# Patient Record
Sex: Female | Born: 1955 | Race: Black or African American | Hispanic: No | Marital: Married | Smoking: Never smoker
Health system: Southern US, Community
[De-identification: ages and names within clinical notes are randomized; demographics above are authoritative.]

## PROBLEM LIST (undated history)

## (undated) DIAGNOSIS — I10 Essential (primary) hypertension: Secondary | ICD-10-CM

## (undated) HISTORY — PX: TONSILLECTOMY: SUR1361

## (undated) HISTORY — PX: CHOLECYSTECTOMY: SHX55

## (undated) HISTORY — PX: BACK SURGERY: SHX140

---

## 2005-03-05 ENCOUNTER — Ambulatory Visit: Payer: Self-pay | Admitting: Occupational Therapy

## 2006-04-01 ENCOUNTER — Ambulatory Visit: Payer: Self-pay | Admitting: Nurse Practitioner

## 2006-09-02 ENCOUNTER — Ambulatory Visit: Payer: Self-pay | Admitting: Unknown Physician Specialty

## 2007-04-06 ENCOUNTER — Ambulatory Visit: Payer: Self-pay | Admitting: Nurse Practitioner

## 2008-04-06 ENCOUNTER — Ambulatory Visit: Payer: Self-pay | Admitting: Nurse Practitioner

## 2009-04-27 ENCOUNTER — Ambulatory Visit: Payer: Self-pay | Admitting: Nurse Practitioner

## 2009-07-05 ENCOUNTER — Encounter: Payer: Self-pay | Admitting: Orthopedic Surgery

## 2009-07-26 ENCOUNTER — Encounter: Payer: Self-pay | Admitting: Orthopedic Surgery

## 2009-08-25 ENCOUNTER — Encounter: Payer: Self-pay | Admitting: Orthopedic Surgery

## 2009-09-25 ENCOUNTER — Encounter: Payer: Self-pay | Admitting: Orthopedic Surgery

## 2010-01-02 ENCOUNTER — Encounter: Payer: Self-pay | Admitting: Emergency Medicine

## 2010-01-02 ENCOUNTER — Observation Stay (HOSPITAL_COMMUNITY): Admission: EM | Admit: 2010-01-02 | Discharge: 2010-01-04 | Payer: Self-pay | Admitting: Cardiovascular Disease

## 2010-02-11 ENCOUNTER — Emergency Department (HOSPITAL_COMMUNITY)
Admission: EM | Admit: 2010-02-11 | Discharge: 2010-02-11 | Payer: Self-pay | Source: Home / Self Care | Admitting: Emergency Medicine

## 2010-02-12 ENCOUNTER — Emergency Department (HOSPITAL_COMMUNITY)
Admission: EM | Admit: 2010-02-12 | Discharge: 2010-02-12 | Payer: Self-pay | Source: Home / Self Care | Admitting: Emergency Medicine

## 2010-03-05 ENCOUNTER — Emergency Department (HOSPITAL_COMMUNITY)
Admission: EM | Admit: 2010-03-05 | Discharge: 2010-03-05 | Payer: Self-pay | Source: Home / Self Care | Admitting: Emergency Medicine

## 2010-03-12 LAB — CBC
HCT: 35.1 % — ABNORMAL LOW (ref 36.0–46.0)
Hemoglobin: 12.6 g/dL (ref 12.0–15.0)
MCH: 30.7 pg (ref 26.0–34.0)
MCHC: 35.9 g/dL (ref 30.0–36.0)
MCV: 85.4 fL (ref 78.0–100.0)
Platelets: 185 10*3/uL (ref 150–400)
RBC: 4.11 MIL/uL (ref 3.87–5.11)
RDW: 12.3 % (ref 11.5–15.5)
WBC: 5.3 10*3/uL (ref 4.0–10.5)

## 2010-03-12 LAB — DIFFERENTIAL
Basophils Absolute: 0 10*3/uL (ref 0.0–0.1)
Basophils Relative: 0 % (ref 0–1)
Eosinophils Absolute: 0 10*3/uL (ref 0.0–0.7)
Eosinophils Relative: 1 % (ref 0–5)
Lymphocytes Relative: 30 % (ref 12–46)
Lymphs Abs: 1.6 10*3/uL (ref 0.7–4.0)
Monocytes Absolute: 0.2 10*3/uL (ref 0.1–1.0)
Monocytes Relative: 4 % (ref 3–12)
Neutro Abs: 3.5 10*3/uL (ref 1.7–7.7)
Neutrophils Relative %: 65 % (ref 43–77)

## 2010-03-12 LAB — BASIC METABOLIC PANEL
BUN: 9 mg/dL (ref 6–23)
CO2: 27 mEq/L (ref 19–32)
Calcium: 9.5 mg/dL (ref 8.4–10.5)
Chloride: 104 mEq/L (ref 96–112)
Creatinine, Ser: 0.68 mg/dL (ref 0.4–1.2)
GFR calc Af Amer: >60 mL/min (ref >60)
GFR calc non Af Amer: >60 mL/min (ref >60)
Glucose, Bld: 101 mg/dL — ABNORMAL HIGH (ref 70–99)
Potassium: 3.8 mEq/L (ref 3.5–5.1)
Sodium: 140 mEq/L (ref 135–145)

## 2010-05-07 LAB — BASIC METABOLIC PANEL
BUN: 15 mg/dL (ref 6–23)
CO2: 25 mEq/L (ref 19–32)
Calcium: 9 mg/dL (ref 8.4–10.5)
Chloride: 102 mEq/L (ref 96–112)
Creatinine, Ser: 0.79 mg/dL (ref 0.4–1.2)
GFR calc Af Amer: >60 mL/min (ref >60)
GFR calc non Af Amer: >60 mL/min (ref >60)
Glucose, Bld: 109 mg/dL — ABNORMAL HIGH (ref 70–99)
Potassium: 3.5 mEq/L (ref 3.5–5.1)
Sodium: 134 mEq/L — ABNORMAL LOW (ref 135–145)

## 2010-05-07 LAB — URINALYSIS, ROUTINE W REFLEX MICROSCOPIC
Bilirubin Urine: NEGATIVE
Glucose, UA: NEGATIVE mg/dL
Ketones, ur: NEGATIVE mg/dL
Nitrite: NEGATIVE
Protein, ur: NEGATIVE mg/dL
Specific Gravity, Urine: 1.01 (ref 1.005–1.030)
Urobilinogen, UA: 0.2 mg/dL (ref 0.0–1.0)
pH: 6 (ref 5.0–8.0)

## 2010-05-07 LAB — POCT CARDIAC MARKERS
CKMB, poc: <1 ng/mL — ABNORMAL LOW (ref 1.0–8.0)
CKMB, poc: <1 ng/mL — ABNORMAL LOW (ref 1.0–8.0)
CKMB, poc: <1 ng/mL — ABNORMAL LOW (ref 1.0–8.0)
Myoglobin, poc: 40.5 ng/mL (ref 12–200)
Myoglobin, poc: 45.4 ng/mL (ref 12–200)
Myoglobin, poc: 45.9 ng/mL (ref 12–200)
Troponin i, poc: <0.05 ng/mL (ref 0.00–0.09)
Troponin i, poc: <0.05 ng/mL (ref 0.00–0.09)
Troponin i, poc: <0.05 ng/mL (ref 0.00–0.09)

## 2010-05-07 LAB — URINE MICROSCOPIC-ADD ON

## 2010-05-07 LAB — CBC
HCT: 33.5 % — ABNORMAL LOW (ref 36.0–46.0)
Hemoglobin: 11.8 g/dL — ABNORMAL LOW (ref 12.0–15.0)
MCH: 30.6 pg (ref 26.0–34.0)
MCHC: 35.2 g/dL (ref 30.0–36.0)
MCV: 86.8 fL (ref 78.0–100.0)
Platelets: 183 10*3/uL (ref 150–400)
RBC: 3.86 MIL/uL — ABNORMAL LOW (ref 3.87–5.11)
RDW: 12.6 % (ref 11.5–15.5)
WBC: 9 10*3/uL (ref 4.0–10.5)

## 2010-05-07 LAB — DIFFERENTIAL
Basophils Absolute: 0 10*3/uL (ref 0.0–0.1)
Basophils Relative: 0 % (ref 0–1)
Eosinophils Absolute: 0.1 10*3/uL (ref 0.0–0.7)
Eosinophils Relative: 2 % (ref 0–5)
Lymphocytes Relative: 31 % (ref 12–46)
Lymphs Abs: 2.8 10*3/uL (ref 0.7–4.0)
Monocytes Absolute: 0.4 10*3/uL (ref 0.1–1.0)
Monocytes Relative: 4 % (ref 3–12)
Neutro Abs: 5.7 10*3/uL (ref 1.7–7.7)
Neutrophils Relative %: 63 % (ref 43–77)

## 2010-05-08 LAB — CK TOTAL AND CKMB (NOT AT ARMC)
CK, MB: 1.1 ng/mL (ref 0.3–4.0)
CK, MB: 1.5 ng/mL (ref 0.3–4.0)
Relative Index: INVALID (ref 0.0–2.5)
Relative Index: INVALID (ref 0.0–2.5)
Total CK: 83 U/L (ref 7–177)
Total CK: 85 U/L (ref 7–177)

## 2010-05-08 LAB — URINALYSIS, ROUTINE W REFLEX MICROSCOPIC
Bilirubin Urine: NEGATIVE
Glucose, UA: NEGATIVE mg/dL
Hgb urine dipstick: NEGATIVE
Ketones, ur: NEGATIVE mg/dL
Nitrite: NEGATIVE
Protein, ur: NEGATIVE mg/dL
Specific Gravity, Urine: 1.01 (ref 1.005–1.030)
Urobilinogen, UA: 0.2 mg/dL (ref 0.0–1.0)
pH: 7 (ref 5.0–8.0)

## 2010-05-08 LAB — CBC
HCT: 37.1 % (ref 36.0–46.0)
Hemoglobin: 12.6 g/dL (ref 12.0–15.0)
MCH: 29.8 pg (ref 26.0–34.0)
MCH: 30.4 pg (ref 26.0–34.0)
MCHC: 33 g/dL (ref 30.0–36.0)
MCHC: 34.1 g/dL (ref 30.0–36.0)
MCV: 89.1 fL (ref 78.0–100.0)
MCV: 90.3 fL (ref 78.0–100.0)
Platelets: 199 10*3/uL (ref 150–400)
Platelets: 218 10*3/uL (ref 150–400)
RBC: 4.16 MIL/uL (ref 3.87–5.11)
RDW: 13.3 % (ref 11.5–15.5)
WBC: 8.7 10*3/uL (ref 4.0–10.5)

## 2010-05-08 LAB — CARDIAC PANEL(CRET KIN+CKTOT+MB+TROPI)
CK, MB: 1.1 ng/mL (ref 0.3–4.0)
CK, MB: 1.6 ng/mL (ref 0.3–4.0)
Relative Index: INVALID (ref 0.0–2.5)
Relative Index: INVALID (ref 0.0–2.5)
Relative Index: INVALID (ref 0.0–2.5)
Total CK: 76 U/L (ref 7–177)
Total CK: 86 U/L (ref 7–177)
Total CK: 89 U/L (ref 7–177)
Troponin I: 0.05 ng/mL (ref 0.00–0.06)
Troponin I: 0.08 ng/mL — ABNORMAL HIGH (ref 0.00–0.06)
Troponin I: 0.16 ng/mL — ABNORMAL HIGH (ref 0.00–0.06)

## 2010-05-08 LAB — TROPONIN I
Troponin I: 0.13 ng/mL — ABNORMAL HIGH (ref 0.00–0.06)
Troponin I: 0.28 ng/mL — ABNORMAL HIGH (ref 0.00–0.06)

## 2010-05-08 LAB — DIFFERENTIAL
Basophils Absolute: 0 10*3/uL (ref 0.0–0.1)
Basophils Relative: 0 % (ref 0–1)
Eosinophils Absolute: 0 10*3/uL (ref 0.0–0.7)
Eosinophils Relative: 1 % (ref 0–5)
Lymphocytes Relative: 10 % — ABNORMAL LOW (ref 12–46)
Lymphs Abs: 0.9 10*3/uL (ref 0.7–4.0)
Monocytes Absolute: 0.3 10*3/uL (ref 0.1–1.0)
Monocytes Relative: 4 % (ref 3–12)
Neutro Abs: 7.4 10*3/uL (ref 1.7–7.7)
Neutrophils Relative %: 86 % — ABNORMAL HIGH (ref 43–77)

## 2010-05-08 LAB — BASIC METABOLIC PANEL
BUN: 10 mg/dL (ref 6–23)
BUN: 14 mg/dL (ref 6–23)
BUN: 16 mg/dL (ref 6–23)
CO2: 25 mEq/L (ref 19–32)
CO2: 27 mEq/L (ref 19–32)
CO2: 28 mEq/L (ref 19–32)
Calcium: 9.2 mg/dL (ref 8.4–10.5)
Calcium: 9.3 mg/dL (ref 8.4–10.5)
Calcium: 9.4 mg/dL (ref 8.4–10.5)
Chloride: 104 mEq/L (ref 96–112)
Chloride: 109 mEq/L (ref 96–112)
Chloride: 111 mEq/L (ref 96–112)
Creatinine, Ser: 0.68 mg/dL (ref 0.4–1.2)
Creatinine, Ser: 0.73 mg/dL (ref 0.4–1.2)
Creatinine, Ser: 0.74 mg/dL (ref 0.4–1.2)
GFR calc Af Amer: >60 mL/min (ref >60)
GFR calc Af Amer: >60 mL/min (ref >60)
GFR calc non Af Amer: >60 mL/min (ref >60)
GFR calc non Af Amer: >60 mL/min (ref >60)
Glucose, Bld: 110 mg/dL — ABNORMAL HIGH (ref 70–99)
Glucose, Bld: 119 mg/dL — ABNORMAL HIGH (ref 70–99)
Glucose, Bld: 89 mg/dL (ref 70–99)
Potassium: 3.6 mEq/L (ref 3.5–5.1)
Potassium: 3.6 mEq/L (ref 3.5–5.1)
Sodium: 139 mEq/L (ref 135–145)
Sodium: 140 mEq/L (ref 135–145)

## 2010-05-08 LAB — URINE MICROSCOPIC-ADD ON

## 2010-05-08 LAB — PROTIME-INR
INR: 1.08 (ref 0.00–1.49)
Prothrombin Time: 14.2 seconds (ref 11.6–15.2)

## 2010-05-08 LAB — GLUCOSE, CAPILLARY: Glucose-Capillary: 116 mg/dL — ABNORMAL HIGH (ref 70–99)

## 2010-05-08 LAB — LIPID PANEL
Cholesterol: 169 mg/dL (ref 0–200)
HDL: 63 mg/dL (ref >39)
LDL Cholesterol: 99 mg/dL (ref 0–99)
Total CHOL/HDL Ratio: 2.7 RATIO
Triglycerides: 36 mg/dL (ref <150)
VLDL: 7 mg/dL (ref 0–40)

## 2010-05-08 LAB — TSH: TSH: 3.749 u[IU]/mL (ref 0.350–4.500)

## 2010-05-08 LAB — HEPARIN LEVEL (UNFRACTIONATED)
Heparin Unfractionated: 0.32 IU/mL (ref 0.30–0.70)
Heparin Unfractionated: <0.1 IU/mL — ABNORMAL LOW (ref 0.30–0.70)

## 2010-05-08 LAB — APTT: aPTT: 34 seconds (ref 24–37)

## 2010-06-11 ENCOUNTER — Ambulatory Visit: Payer: Self-pay | Admitting: Nurse Practitioner

## 2010-06-20 ENCOUNTER — Ambulatory Visit: Payer: Self-pay | Admitting: Unknown Physician Specialty

## 2010-06-28 ENCOUNTER — Ambulatory Visit: Payer: Self-pay | Admitting: Nurse Practitioner

## 2010-06-29 ENCOUNTER — Ambulatory Visit: Payer: Self-pay | Admitting: Neurology

## 2010-07-04 ENCOUNTER — Ambulatory Visit: Payer: Self-pay | Admitting: Unknown Physician Specialty

## 2010-07-06 LAB — PATHOLOGY REPORT

## 2010-07-31 ENCOUNTER — Ambulatory Visit: Payer: Self-pay | Admitting: Neurology

## 2010-08-03 ENCOUNTER — Ambulatory Visit: Payer: Self-pay | Admitting: Surgery

## 2010-08-06 ENCOUNTER — Ambulatory Visit: Payer: Self-pay | Admitting: Surgery

## 2011-08-05 ENCOUNTER — Ambulatory Visit: Payer: Self-pay | Admitting: Nurse Practitioner

## 2012-08-10 ENCOUNTER — Ambulatory Visit: Payer: Self-pay

## 2014-05-06 ENCOUNTER — Ambulatory Visit: Payer: Self-pay | Admitting: Nurse Practitioner

## 2014-11-17 ENCOUNTER — Emergency Department (HOSPITAL_COMMUNITY): Payer: BC Managed Care – PPO

## 2014-11-17 ENCOUNTER — Inpatient Hospital Stay (HOSPITAL_COMMUNITY)
Admission: EM | Admit: 2014-11-17 | Discharge: 2014-11-18 | DRG: 313 | Disposition: A | Discharge status: Home / Self Care | Payer: BC Managed Care – PPO | Attending: Cardiovascular Disease | Admitting: Cardiovascular Disease

## 2014-11-17 ENCOUNTER — Encounter (HOSPITAL_COMMUNITY): Payer: Self-pay

## 2014-11-17 DIAGNOSIS — R899 Unspecified abnormal finding in specimens from other organs, systems and tissues: Secondary | ICD-10-CM | POA: Diagnosis present

## 2014-11-17 DIAGNOSIS — E785 Hyperlipidemia, unspecified: Secondary | ICD-10-CM | POA: Diagnosis present

## 2014-11-17 DIAGNOSIS — I10 Essential (primary) hypertension: Secondary | ICD-10-CM | POA: Diagnosis present

## 2014-11-17 DIAGNOSIS — R7989 Other specified abnormal findings of blood chemistry: Secondary | ICD-10-CM | POA: Diagnosis not present

## 2014-11-17 DIAGNOSIS — Z7982 Long term (current) use of aspirin: Secondary | ICD-10-CM | POA: Diagnosis not present

## 2014-11-17 DIAGNOSIS — Z79899 Other long term (current) drug therapy: Secondary | ICD-10-CM

## 2014-11-17 DIAGNOSIS — Z9049 Acquired absence of other specified parts of digestive tract: Secondary | ICD-10-CM | POA: Diagnosis not present

## 2014-11-17 DIAGNOSIS — E876 Hypokalemia: Secondary | ICD-10-CM | POA: Diagnosis present

## 2014-11-17 DIAGNOSIS — I249 Acute ischemic heart disease, unspecified: Secondary | ICD-10-CM | POA: Diagnosis present

## 2014-11-17 DIAGNOSIS — R0789 Other chest pain: Principal | ICD-10-CM | POA: Diagnosis present

## 2014-11-17 DIAGNOSIS — R079 Chest pain, unspecified: Secondary | ICD-10-CM

## 2014-11-17 HISTORY — DX: Essential (primary) hypertension: I10

## 2014-11-17 LAB — URINALYSIS, ROUTINE W REFLEX MICROSCOPIC
Bilirubin Urine: NEGATIVE
GLUCOSE, UA: NEGATIVE mg/dL
Hgb urine dipstick: NEGATIVE
Ketones, ur: NEGATIVE mg/dL
NITRITE: NEGATIVE
PH: 6 (ref 5.0–8.0)
Protein, ur: NEGATIVE mg/dL
SPECIFIC GRAVITY, URINE: 1.01 (ref 1.005–1.030)
Urobilinogen, UA: 0.2 mg/dL (ref 0.0–1.0)

## 2014-11-17 LAB — CBC WITH DIFFERENTIAL/PLATELET
BASOS ABS: 0 10*3/uL (ref 0.0–0.1)
BASOS PCT: 0 %
EOS ABS: 0.2 10*3/uL (ref 0.0–0.7)
Eosinophils Relative: 3 %
HEMATOCRIT: 35.8 % — AB (ref 36.0–46.0)
Hemoglobin: 12.1 g/dL (ref 12.0–15.0)
Lymphocytes Relative: 21 %
Lymphs Abs: 1.9 10*3/uL (ref 0.7–4.0)
MCH: 30.3 pg (ref 26.0–34.0)
MCHC: 33.8 g/dL (ref 30.0–36.0)
MCV: 89.5 fL (ref 78.0–100.0)
MONO ABS: 0.4 10*3/uL (ref 0.1–1.0)
Monocytes Relative: 4 %
NEUTROS ABS: 6.5 10*3/uL (ref 1.7–7.7)
Neutrophils Relative %: 72 %
PLATELETS: 205 10*3/uL (ref 150–400)
RBC: 4 MIL/uL (ref 3.87–5.11)
RDW: 12.7 % (ref 11.5–15.5)
WBC: 9.1 10*3/uL (ref 4.0–10.5)

## 2014-11-17 LAB — URINE MICROSCOPIC-ADD ON

## 2014-11-17 LAB — COMPREHENSIVE METABOLIC PANEL
ALBUMIN: 3.9 g/dL (ref 3.5–5.0)
ALT: 17 U/L (ref 14–54)
AST: 24 U/L (ref 15–41)
Alkaline Phosphatase: 74 U/L (ref 38–126)
Anion gap: 6 (ref 5–15)
BILIRUBIN TOTAL: 0.4 mg/dL (ref 0.3–1.2)
BUN: 16 mg/dL (ref 6–20)
CHLORIDE: 103 mmol/L (ref 101–111)
CO2: 29 mmol/L (ref 22–32)
Calcium: 9.2 mg/dL (ref 8.9–10.3)
Creatinine, Ser: 0.7 mg/dL (ref 0.44–1.00)
GFR calc Af Amer: >60 mL/min (ref >60)
GFR calc non Af Amer: >60 mL/min (ref >60)
GLUCOSE: 103 mg/dL — AB (ref 65–99)
POTASSIUM: 3.3 mmol/L — AB (ref 3.5–5.1)
SODIUM: 138 mmol/L (ref 135–145)
Total Protein: 7.6 g/dL (ref 6.5–8.1)

## 2014-11-17 LAB — I-STAT TROPONIN, ED: TROPONIN I, POC: 0.08 ng/mL (ref 0.00–0.08)

## 2014-11-17 LAB — TROPONIN I
TROPONIN I: 0.4 ng/mL — AB (ref <0.031)
Troponin I: 0.11 ng/mL — ABNORMAL HIGH (ref <0.031)

## 2014-11-17 LAB — HEPARIN LEVEL (UNFRACTIONATED): Heparin Unfractionated: 0.9 IU/mL — ABNORMAL HIGH (ref 0.30–0.70)

## 2014-11-17 MED ORDER — HEPARIN BOLUS VIA INFUSION
4000.0000 [IU] | Freq: Once | INTRAVENOUS | Status: AC
  Administered 2014-11-17: 4000 [IU] via INTRAVENOUS

## 2014-11-17 MED ORDER — HEPARIN (PORCINE) IN NACL 100-0.45 UNIT/ML-% IJ SOLN
1000.0000 [IU]/h | INTRAMUSCULAR | Status: DC
  Administered 2014-11-17: 1000 [IU]/h via INTRAVENOUS
  Filled 2014-11-17: qty 250

## 2014-11-17 MED ORDER — METOPROLOL TARTRATE 100 MG PO TABS
100.0000 mg | ORAL_TABLET | Freq: Every day | ORAL | Status: DC
  Administered 2014-11-17: 100 mg via ORAL
  Filled 2014-11-17: qty 1

## 2014-11-17 MED ORDER — FERROUS SULFATE 325 (65 FE) MG PO TABS: 325.0000 mg | ORAL_TABLET | Freq: Every day | ORAL | Status: DC

## 2014-11-17 MED ORDER — HYDROCHLOROTHIAZIDE 25 MG PO TABS
25.0000 mg | ORAL_TABLET | Freq: Every day | ORAL | Status: DC
  Administered 2014-11-17 – 2014-11-18 (×2): 25 mg via ORAL
  Filled 2014-11-17 (×2): qty 1

## 2014-11-17 MED ORDER — ATORVASTATIN CALCIUM 40 MG PO TABS
40.0000 mg | ORAL_TABLET | Freq: Every day | ORAL | Status: DC
  Administered 2014-11-17 – 2014-11-18 (×2): 40 mg via ORAL
  Filled 2014-11-17 (×2): qty 1

## 2014-11-17 MED ORDER — ASPIRIN EC 81 MG PO TBEC
81.0000 mg | DELAYED_RELEASE_TABLET | Freq: Every day | ORAL | Status: DC
  Administered 2014-11-18: 81 mg via ORAL
  Filled 2014-11-17: qty 1

## 2014-11-17 MED ORDER — HEPARIN (PORCINE) IN NACL 100-0.45 UNIT/ML-% IJ SOLN
550.0000 [IU]/h | INTRAMUSCULAR | Status: DC
Start: 2014-11-17 — End: 2014-11-18

## 2014-11-17 MED ORDER — NITROGLYCERIN 0.4 MG SL SUBL: 0.4000 mg | SUBLINGUAL_TABLET | SUBLINGUAL | Status: DC | PRN

## 2014-11-17 MED ORDER — ACETAMINOPHEN 325 MG PO TABS
650.0000 mg | ORAL_TABLET | ORAL | Status: DC | PRN
  Administered 2014-11-18: 650 mg via ORAL
  Filled 2014-11-17: qty 2

## 2014-11-17 MED ORDER — ASPIRIN 300 MG RE SUPP: 300.0000 mg | RECTAL | Status: AC

## 2014-11-17 MED ORDER — ASPIRIN 81 MG PO CHEW
324.0000 mg | CHEWABLE_TABLET | ORAL | Status: AC
  Administered 2014-11-17: 324 mg via ORAL
  Filled 2014-11-17: qty 4

## 2014-11-17 MED ORDER — ONDANSETRON HCL 4 MG/2ML IJ SOLN: 4.0000 mg | Freq: Three times a day (TID) | INTRAMUSCULAR | Status: AC | PRN

## 2014-11-17 MED ORDER — ATORVASTATIN CALCIUM 40 MG PO TABS: 40.0000 mg | ORAL_TABLET | Freq: Every day | ORAL | Status: DC

## 2014-11-17 MED ORDER — POTASSIUM CHLORIDE ER 10 MEQ PO TBCR
20.0000 meq | EXTENDED_RELEASE_TABLET | Freq: Three times a day (TID) | ORAL | Status: DC
  Administered 2014-11-17 – 2014-11-18 (×3): 20 meq via ORAL
  Filled 2014-11-17 (×7): qty 2

## 2014-11-17 MED ORDER — HEPARIN (PORCINE) IN NACL 100-0.45 UNIT/ML-% IJ SOLN: 1000.0000 [IU]/h | INTRAMUSCULAR | Status: DC

## 2014-11-17 NOTE — ED Notes (Signed)
Ems reports pt started feeling lightheaded, palpitations, upper abd pain/burning, and generalized weakness.  EMS reports 12 lead ekg wnl, cbg 99.  Pt alert and oriented.  Pt reports started taking HCTZ 2 months ago.  Also no bm since Tuesday.  Last voided at 5am this morning.

## 2014-11-17 NOTE — ED Provider Notes (Signed)
CSN: [id number]     Arrival date & time 11/17/14  1117 History   First MD Initiated Contact with Patient 11/17/14 1138     Chief Complaint  Patient presents with  . Fatigue     HPI  Expand All Collapse All   Ems reports pt started feeling lightheaded, palpitations, upper abd pain/burning, and generalized weakness. EMS reports 12 lead ekg wnl, cbg 99. Pt alert and oriented. Pt reports started taking HCTZ 2 months ago. Also no bm since Tuesday. Last voided at 5am this morning.        Past Medical History  Diagnosis Date  . Hypertension    Past Surgical History  Procedure Laterality Date  . Cholecystectomy    . Back surgery    . Tonsillectomy     No family history on file. Social History  Substance Use Topics  . Smoking status: Never Smoker   . Smokeless tobacco: None  . Alcohol Use: No   OB History    No data available     Review of Systems All other systems reviewed and are negative   Allergies  Review of patient's allergies indicates no known allergies.  Home Medications   Prior to Admission medications   Medication Sig Start Date End Date Taking? Authorizing Provider  aspirin EC 81 MG tablet Take 81 mg by mouth daily.   Yes Historical Provider, MD  Ca Carbonate-Mag Hydroxide (ROLAIDS PO) Take 1 tablet by mouth daily.   Yes Historical Provider, MD  cetirizine (ZYRTEC) 10 MG tablet Take 10 mg by mouth daily.   Yes Historical Provider, MD  cholecalciferol (VITAMIN D) 1000 UNITS tablet Take 1,000 Units by mouth daily.   Yes Historical Provider, MD  ferrous sulfate 325 (65 FE) MG tablet Take 325 mg by mouth daily with breakfast.   Yes Historical Provider, MD  hydrochlorothiazide (HYDRODIURIL) 25 MG tablet Take 25 mg by mouth daily.   Yes Historical Provider, MD  metoprolol (LOPRESSOR) 100 MG tablet Take 100 mg by mouth daily.   Yes Historical Provider, MD   BP 125/59 mmHg  Pulse 63  Temp(Src) 98.2 F (36.8 C) (Oral)  Resp 17  Ht  (1.575 m)  Wt 138 lb  (62.596 kg)  BMI 25.23 kg/m2  SpO2 100% Physical Exam Physical Exam  Nursing note and vitals reviewed. Constitutional: She is oriented to person, place, and time. She appears well-developed and well-nourished. No distress.  HENT:  Head: Normocephalic and atraumatic.  Eyes: Pupils are equal, round, and reactive to light.  Neck: Normal range of motion.  Cardiovascular: Normal rate and intact distal pulses.   Pulmonary/Chest: No respiratory distress.  Abdominal: Normal appearance. She exhibits no distension.  Musculoskeletal: Normal range of motion.  Neurological: She is alert and oriented to person, place, and time. No cranial nerve deficit.  Skin: Skin is warm and dry. No rash noted.  Psychiatric: She has a normal mood and affect. Her behavior is normal.   ED Course  Procedures (including critical care time) Labs Review Labs Reviewed  CBC WITH DIFFERENTIAL/PLATELET - Abnormal; Notable for the following:    HCT 35.8 (*)    All other components within normal limits  COMPREHENSIVE METABOLIC PANEL - Abnormal; Notable for the following:    Potassium 3.3 (*)    Glucose, Bld 103 (*)    All other components within normal limits  TROPONIN I - Abnormal; Notable for the following:    Troponin I 0.11 (*)    All other components within  normal limits  URINALYSIS, ROUTINE W REFLEX MICROSCOPIC (NOT AT [facility])  Rosezena Sensor, ED    Imaging Review Dg Chest Portable 1 View  11/17/2014   CLINICAL DATA:  Sudden onset dizziness, weakness, and hypertension at work today.  EXAM: PORTABLE CHEST 1 VIEW  COMPARISON:  02/11/2010  FINDINGS: Lower cervical plate and screw fixator.  The lungs appear clear.  Cardiac and mediastinal contours normal.  No pleural effusion identified.  IMPRESSION: 1. No significant abnormality observed.   Electronically Signed   By: Gaylyn Rong M.D.   On: 11/17/2014 13:12   I have personally reviewed and evaluated these images and lab results as part of my medical  decision-making.   EKG Interpretation   Date/Time:  Thursday November 17 2014 11:23:28 EDT Ventricular Rate:  66 PR Interval:  161 QRS Duration: 71 QT Interval:  377 QTC Calculation: 395 R Axis:   58 Text Interpretation:  Sinus rhythm Baseline wander in lead(s) I V1  Confirmed by BEATON  MD, ROBERT ([id number]) on 11/17/2014 11:38:44 AM     CRITICAL CARE Performed by: Nelva Nay L Total critical care time: 30 min Critical care time was exclusive of separately billable procedures and treating other patients. Critical care was necessary to treat or prevent imminent or life-threatening deterioration. Critical care was time spent personally by me on the following activities: development of treatment plan with patient and/or surrogate as well as nursing, discussions with consultants, evaluation of patient's response to treatment, examination of patient, obtaining history from patient or surrogate, ordering and performing treatments and interventions, ordering and review of laboratory studies, ordering and review of radiographic studies, pulse oximetry and re-evaluation of patient's condition.   I discussed the case with her cardiologist Dr. Arva Chafe, who requested she was to be transferred to [facility].  Heparin bolus and heparin drip were started. MDM   Final diagnoses:  Abnormal laboratory test        Nelva Nay, MD 11/17/14 1326

## 2014-11-17 NOTE — H&P (Signed)
Referring Physician:  [name] is an 59 y.o. female.                       Chief Complaint: Weakness and upper abdominal tightness  HPI: 59 year old female with normal coronaries in 2012 has 1 day history of generalized weakness, epigastric area tightness and elevated troponin-I. EKG was unremarkable. PMH positive for uncontrolled hypertension.  Past Medical History  Diagnosis Date  . Hypertension       Past Surgical History  Procedure Laterality Date  . Cholecystectomy    . Back surgery    . Tonsillectomy      No family history on file. Social History:  reports that she has never smoked. She does not have any smokeless tobacco history on file. She reports that she does not drink alcohol or use illicit drugs.  Allergies: No Known Allergies  Medications Prior to Admission  Medication Sig Dispense Refill  . aspirin EC 81 MG tablet Take 81 mg by mouth daily.    . Ca Carbonate-Mag Hydroxide (ROLAIDS PO) Take 1 tablet by mouth daily.    . cetirizine (ZYRTEC) 10 MG tablet Take 10 mg by mouth daily.    . cholecalciferol (VITAMIN D) 1000 UNITS tablet Take 1,000 Units by mouth daily.    . ferrous sulfate 325 (65 FE) MG tablet Take 325 mg by mouth daily with breakfast.    . hydrochlorothiazide (HYDRODIURIL) 25 MG tablet Take 25 mg by mouth daily.    . metoprolol (LOPRESSOR) 100 MG tablet Take 100 mg by mouth daily.      Results for orders placed or performed during the hospital encounter of 11/17/14 (from the past 48 hour(s))  CBC with Differential     Status: Abnormal   Collection Time: 11/17/14 11:46 AM  Result Value Ref Range   WBC 9.1 4.0 - 10.5 K/uL   RBC 4.00 3.87 - 5.11 MIL/uL   Hemoglobin 12.1 12.0 - 15.0 g/dL   HCT 35.8 (L) 36.0 - 46.0 %   MCV 89.5 78.0 - 100.0 fL   MCH 30.3 26.0 - 34.0 pg   MCHC 33.8 30.0 - 36.0 g/dL   RDW 12.7 11.5 - 15.5 %   Platelets 205 150 - 400 K/uL   Neutrophils Relative % 72 %   Neutro Abs 6.5 1.7 - 7.7 K/uL   Lymphocytes Relative  21 %   Lymphs Abs 1.9 0.7 - 4.0 K/uL   Monocytes Relative 4 %   Monocytes Absolute 0.4 0.1 - 1.0 K/uL   Eosinophils Relative 3 %   Eosinophils Absolute 0.2 0.0 - 0.7 K/uL   Basophils Relative 0 %   Basophils Absolute 0.0 0.0 - 0.1 K/uL  Comprehensive metabolic panel     Status: Abnormal   Collection Time: 11/17/14 11:46 AM  Result Value Ref Range   Sodium 138 135 - 145 mmol/L   Potassium 3.3 (L) 3.5 - 5.1 mmol/L   Chloride 103 101 - 111 mmol/L   CO2 29 22 - 32 mmol/L   Glucose, Bld 103 (H) 65 - 99 mg/dL   BUN 16 6 - 20 mg/dL   Creatinine, Ser 0.70 0.44 - 1.00 mg/dL   Calcium 9.2 8.9 - 10.3 mg/dL   Total Protein 7.6 6.5 - 8.1 g/dL   Albumin 3.9 3.5 - 5.0 g/dL   AST 24 15 - 41 U/L   ALT 17 14 - 54 U/L   Alkaline Phosphatase 74 38 - 126 U/L  Total Bilirubin 0.4 0.3 - 1.2 mg/dL   GFR calc non Af Amer >60 >60 mL/min   GFR calc Af Amer >60 >60 mL/min    Comment: (NOTE) The eGFR has been calculated using the CKD EPI equation. This calculation has not been validated in all clinical situations. eGFR's persistently <60 mL/min signify possible Chronic Kidney Disease.    Anion gap 6 5 - 15  Troponin I     Status: Abnormal   Collection Time: 11/17/14 11:46 AM  Result Value Ref Range   Troponin I 0.11 (H) <0.031 ng/mL    Comment:        PERSISTENTLY INCREASED TROPONIN VALUES IN THE RANGE OF 0.04-0.49 ng/mL CAN BE SEEN IN:       -UNSTABLE ANGINA       -CONGESTIVE HEART FAILURE       -MYOCARDITIS       -CHEST TRAUMA       -ARRYHTHMIAS       -LATE PRESENTING MYOCARDIAL INFARCTION       -COPD   CLINICAL FOLLOW-UP RECOMMENDED.   I-stat troponin, ED     Status: None   Collection Time: 11/17/14 11:50 AM  Result Value Ref Range   Troponin i, poc 0.08 0.00 - 0.08 ng/mL   Comment 3            Comment: Due to the release kinetics of cTnI, a negative result within the first hours of the onset of symptoms does not rule out myocardial infarction with certainty. If myocardial  infarction is still suspected, repeat the test at appropriate intervals.   Urinalysis, Routine w reflex microscopic (not at [facility])     Status: Abnormal   Collection Time: 11/17/14  1:39 PM  Result Value Ref Range   Color, Urine YELLOW YELLOW   APPearance CLEAR CLEAR   Specific Gravity, Urine 1.010 1.005 - 1.030   pH 6.0 5.0 - 8.0   Glucose, UA NEGATIVE NEGATIVE mg/dL   Hgb urine dipstick NEGATIVE NEGATIVE   Bilirubin Urine NEGATIVE NEGATIVE   Ketones, ur NEGATIVE NEGATIVE mg/dL   Protein, ur NEGATIVE NEGATIVE mg/dL   Urobilinogen, UA 0.2 0.0 - 1.0 mg/dL   Nitrite NEGATIVE NEGATIVE   Leukocytes, UA SMALL (A) NEGATIVE  Urine microscopic-add on     Status: Abnormal   Collection Time: 11/17/14  1:39 PM  Result Value Ref Range   Squamous Epithelial / LPF MANY (A) RARE   WBC, UA 7-10 <3 WBC/hpf   Bacteria, UA FEW (A) RARE   Dg Chest Portable 1 View  11/17/2014   CLINICAL DATA:  Sudden onset dizziness, weakness, and hypertension at work today.  EXAM: PORTABLE CHEST 1 VIEW  COMPARISON:  02/11/2010  FINDINGS: Lower cervical plate and screw fixator.  The lungs appear clear.  Cardiac and mediastinal contours normal.  No pleural effusion identified.  IMPRESSION: 1. No significant abnormality observed.   Electronically Signed   By: Van Clines M.D.   On: 11/17/2014 13:12    Review Of Systems No weight gain/loss, Wears reading glasses, No cataract surgery, No asthma, No COPD, No chest pain, positive nausea, No kidney stone, No hepatitis, No GI or GU bleed, No stroke, seizures or psychiatric admission   Blood pressure 142/71, pulse 72, temperature 98.7 F (37.1 C), temperature source Oral, resp. rate 19, height 5' 2"  (1.575 m), weight 57.698 kg (127 lb 3.2 oz), SpO2 99 %. General appearance: alert, cooperative and appears stated age. No distress. Head: Normocephalic, without obvious abnormality, atraumatic. Eyes:  conjunctivae/corneas clear. PERRL, EOM's intact.  Neck: no adenopathy, no  carotid bruit, no JVD, supple, symmetrical, trachea midline and thyroid not enlarged. Resp: clear to auscultation bilaterally Chest wall: no tenderness Cardio: regular rate and rhythm, S1, S2 normal, II/VI systolic murmur, no click, rub or gallop GI: soft, non-tender; bowel sounds normal; no masses,  no organomegaly. Extremities: extremities normal, atraumatic, no cyanosis or edema Skin: Skin color, texture, turgor normal. No rashes or lesions Neurologic: Alert and oriented X 3, normal strength and tone. Normal symmetric reflexes. Normal coordination and gait  Assessment/Plan Acute coronary syndrome Hypertension Hypokalemia  Admit/r/o MI. Nuclear stress test v/s cardiac cath  [facility], MD  11/17/2014, 6:45 PM

## 2014-11-17 NOTE — ED Notes (Signed)
[facility] arrived  

## 2014-11-17 NOTE — ED Notes (Signed)
Pt left with [facility] 

## 2014-11-17 NOTE — Progress Notes (Signed)
ANTICOAGULATION CONSULT NOTE - Follow Up Consult  Pharmacy Consult for heparin Indication: chest pain/ACS  Labs:  Recent Labs  11/17/14 1146 11/17/14 2010 11/17/14 2235  HGB 12.1  --   --   HCT 35.8*  --   --   PLT 205  --   --   HEPARINUNFRC  --   --  0.90*  CREATININE 0.70  --   --   TROPONINI 0.11* 0.40*  --      Assessment: 58yo female supratherapeutic on heparin with dosing for CP; of note pt rec'd large bolus and initial gtt rate by EDP, may still be affecting level.  Goal of Therapy:  Heparin level 0.3-0.7 units/ml   Plan:  Will decrease heparin gtt by 2 units/kg/hr to 600 units/hr and check level with am labs.  Vernard Gambles, PharmD, BCPS  11/17/2014,11:57 PM

## 2014-11-17 NOTE — Progress Notes (Signed)
ANTICOAGULATION CONSULT NOTE - Initial Consult  Pharmacy Consult:  Heparin Indication: chest pain/ACS  No Known Allergies  Patient Measurements: Height:  (157.5 cm) Weight: 127 lb 3.2 oz (57.698 kg) IBW/kg (Calculated) : 50.1 Heparin Dosing Weight: 58 kg  Vital Signs: Temp: 98.7 F (37.1 C) (09/22 1607) Temp Source: Oral (09/22 1607) BP: 142/71 mmHg (09/22 1607) Pulse Rate: 72 (09/22 1504)  Labs:  Recent Labs  11/17/14 1146  HGB 12.1  HCT 35.8*  PLT 205  CREATININE 0.70  TROPONINI 0.11*    Estimated Creatinine Clearance: 59.1 mL/min (by C-G formula based on Cr of 0.7).   Medical History: Past Medical History  Diagnosis Date  . Hypertension       Assessment: 25 YOF with PMH significant for HTN presented to [facility] ED with lightheadedness, palpitations, upper abdominal pain and generalized weakness.  Patient was started on IV heparin prior to transfer to [facility].  Her troponin was mildly elevated at 0.11.   Goal of Therapy:  Heparin level 0.3-0.7 units/ml Monitor platelets by anticoagulation protocol: Yes    Plan:  - Decrease heparin gtt to 700 units/hr - Check 6 hr HL - Daily HL / CBC - F/U KCL supplementation    Thuy D. Laney Potash, PharmD, BCPS Pager:  [phone] 11/17/2014, 4:24 PM

## 2014-11-18 ENCOUNTER — Inpatient Hospital Stay (HOSPITAL_COMMUNITY): Payer: BC Managed Care – PPO

## 2014-11-18 LAB — HEPARIN LEVEL (UNFRACTIONATED)
HEPARIN UNFRACTIONATED: 0.68 [IU]/mL (ref 0.30–0.70)
Heparin Unfractionated: 0.73 IU/mL — ABNORMAL HIGH (ref 0.30–0.70)
Heparin Unfractionated: 0.39 IU/mL (ref 0.30–0.70)

## 2014-11-18 LAB — LIPID PANEL
CHOL/HDL RATIO: 3.6 ratio
CHOLESTEROL: 174 mg/dL (ref 0–200)
HDL: 49 mg/dL (ref >40)
LDL Cholesterol: 111 mg/dL — ABNORMAL HIGH (ref 0–99)
TRIGLYCERIDES: 72 mg/dL (ref <150)
VLDL: 14 mg/dL (ref 0–40)

## 2014-11-18 LAB — CBC
HCT: 36.3 % (ref 36.0–46.0)
Hemoglobin: 12 g/dL (ref 12.0–15.0)
MCH: 29.7 pg (ref 26.0–34.0)
MCHC: 33.1 g/dL (ref 30.0–36.0)
MCV: 89.9 fL (ref 78.0–100.0)
PLATELETS: 205 10*3/uL (ref 150–400)
RBC: 4.04 MIL/uL (ref 3.87–5.11)
RDW: 13.1 % (ref 11.5–15.5)
WBC: 7.3 10*3/uL (ref 4.0–10.5)

## 2014-11-18 LAB — TROPONIN I
TROPONIN I: 0.14 ng/mL — AB (ref <0.031)
Troponin I: 0.23 ng/mL — ABNORMAL HIGH (ref <0.031)

## 2014-11-18 LAB — BASIC METABOLIC PANEL
ANION GAP: 6 (ref 5–15)
BUN: 10 mg/dL (ref 6–20)
CHLORIDE: 103 mmol/L (ref 101–111)
CO2: 28 mmol/L (ref 22–32)
Calcium: 9.1 mg/dL (ref 8.9–10.3)
Creatinine, Ser: 0.66 mg/dL (ref 0.44–1.00)
Glucose, Bld: 106 mg/dL — ABNORMAL HIGH (ref 65–99)
POTASSIUM: 3.8 mmol/L (ref 3.5–5.1)
SODIUM: 137 mmol/L (ref 135–145)

## 2014-11-18 LAB — PROTIME-INR
INR: 1.18 (ref 0.00–1.49)
Prothrombin Time: 15.1 seconds (ref 11.6–15.2)

## 2014-11-18 MED ORDER — REGADENOSON 0.4 MG/5ML IV SOLN
INTRAVENOUS | Status: AC
  Administered 2014-11-18: 0.4 mg via INTRAVENOUS
  Filled 2014-11-18: qty 5

## 2014-11-18 MED ORDER — ATORVASTATIN CALCIUM 10 MG PO TABS
10.0000 mg | ORAL_TABLET | Freq: Every day | ORAL | Status: DC
Start: 2014-11-18 — End: 2016-10-30

## 2014-11-18 MED ORDER — REGADENOSON 0.4 MG/5ML IV SOLN
0.4000 mg | Freq: Once | INTRAVENOUS | Status: AC
  Administered 2014-11-18: 0.4 mg via INTRAVENOUS
  Filled 2014-11-18: qty 5

## 2014-11-18 MED ORDER — METOPROLOL SUCCINATE ER 100 MG PO TB24
100.0000 mg | ORAL_TABLET | Freq: Every day | ORAL | Status: DC
Start: 2014-11-18 — End: 2014-11-18
  Administered 2014-11-18: 100 mg via ORAL
  Filled 2014-11-18: qty 1

## 2014-11-18 MED ORDER — POTASSIUM CHLORIDE ER 20 MEQ PO TBCR: 10.0000 meq | EXTENDED_RELEASE_TABLET | Freq: Every day | ORAL | Status: DC

## 2014-11-18 MED ORDER — TECHNETIUM TC 99M SESTAMIBI - CARDIOLITE
30.0000 | Freq: Once | INTRAVENOUS | Status: AC | PRN
  Administered 2014-11-18: 30 via INTRAVENOUS

## 2014-11-18 MED ORDER — TECHNETIUM TC 99M SESTAMIBI GENERIC - CARDIOLITE
10.0000 | Freq: Once | INTRAVENOUS | Status: AC | PRN
  Administered 2014-11-18: 10 via INTRAVENOUS

## 2014-11-18 NOTE — Progress Notes (Addendum)
ANTICOAGULATION CONSULT NOTE - Initial Consult  Pharmacy Consult:  Heparin Indication: chest pain/ACS  No Known Allergies  Patient Measurements: Height:  (157.5 cm) Weight: 127 lb (57.607 kg) IBW/kg (Calculated) : 50.1 Heparin Dosing Weight: 58 kg  Vital Signs: Temp: 98.4 F (36.9 C) (09/23 0435) Temp Source: Oral (09/23 0435) BP: 101/55 mmHg (09/23 0435) Pulse Rate: 66 (09/23 0435)  Labs:  Recent Labs  11/17/14 1146 11/17/14 2010 11/17/14 2235 11/18/14 0059 11/18/14 0655  HGB 12.1  --   --   --  12.0  HCT 35.8*  --   --   --  36.3  PLT 205  --   --   --  205  LABPROT  --   --   --   --  15.1  INR  --   --   --   --  1.18  HEPARINUNFRC  --   --  0.90* 0.68 0.73*  CREATININE 0.70  --   --   --  0.66  TROPONINI 0.11* 0.40*  --  0.23* 0.14*    Estimated Creatinine Clearance: 59.1 mL/min (by C-G formula based on Cr of 0.66).   Medical History: Past Medical History  Diagnosis Date  . Hypertension       Assessment: 56 YOF with PMH significant for HTN presented to [facility] ED with lightheadedness, palpitations, upper abdominal pain and generalized weakness.  Patient was started on IV heparin prior to transfer to [facility].  Her troponin was mildly elevated at 0.11. Heparin this morning remains slightly supratherapeutic, CBC is stable.    Goal of Therapy:  Heparin level 0.3-0.7 units/ml Monitor platelets by anticoagulation protocol: Yes    Plan:  - Decrease heparin gtt to 550 units/hr - Check 6 hr HL - Daily HL / CBC - F/u cardiology plans    Agapito Games, PharmD, BCPS Clinical Pharmacist Pager: [phone] 11/18/2014 8:30 AM    =============================   Addendum: - HL 0.39, therapeutic - no bleeding documented   Plan: - Continue heparin gtt at 550 units/hr - Check confirmatory HL    Malikah Lakey D. Laney Potash, PharmD, BCPS Pager:  [phone] 11/18/2014, 4:13 PM

## 2014-11-18 NOTE — Discharge Summary (Signed)
Physician Discharge Summary  Patient ID: Leslie Bentley MRN: [id number] DOB/AGE: 05-02-1955 59 y.o.  Admit date: 11/17/2014 Discharge date: 11/18/2014  Admission Diagnoses: Acute coronary syndrome Hypertension Hypokalemia  Discharge Diagnoses:  Principle Problem: Atypical chest pain Abnormal laboratory test result Hypertension Hyperlipidemia Hypokalemia   Discharged Condition: fair  Hospital Course: 59 year old female with normal coronaries in 2012 has 1 day history of generalized weakness, epigastric area tightness and elevated troponin-I. EKG was unremarkable. PMH positive for uncontrolled hypertension. Her troponin I was minimally elevated. She had no reversible ischemia on her nuclear stress test. Hence she was discharged home with she she resuming her medications with addition of potassium pill and follow up with me in 1 week and primary care in 2 weeks.  Consults: cardiology  Significant Diagnostic Studies: labs: Near normal CBC and CMET except mild hypokalemia corrected with potassium supplement. LDL cholesterol 111, HDL-49 and total 174 with Triglyceride of 72.  EKG-NSR.  Chest X-ray- unremarkable.  Nuclear stress test- No reversible ischemia with EF 66 %.  Treatments: cardiac meds: Aspirin, Atorvastatin, potassium, hydrochlorothiazide and metoprolol succinate.Marland Kitchen  Discharge Exam: Blood pressure 125/68, pulse 68, temperature 98.3 F (36.8 C), temperature source Oral, resp. rate 20, height  (1.575 m), weight 57.607 kg (127 lb), SpO2 99 %. General appearance: alert, cooperative and appears stated age. No distress. Head: Normocephalic, without obvious abnormality, atraumatic. Eyes: conjunctivae/corneas clear. PERRL, EOM's intact.  Neck: no adenopathy, no carotid bruit, no JVD, supple, symmetrical, trachea midline and thyroid not enlarged. Resp: clear to auscultation bilaterally Chest wall: no tenderness Cardio: regular rate and rhythm, S1, S2 normal, II/VI  systolic murmur, no click, rub or gallop GI: soft, non-tender; bowel sounds normal; no masses, no organomegaly. Extremities: extremities normal, atraumatic, no cyanosis or edema Skin: Skin color, texture, turgor normal. No rashes or lesions Neurologic: Alert and oriented X 3, normal strength and tone. Normal symmetric reflexes. Normal coordination and gait  Disposition: 01, Home or self care.     Medication List    TAKE these medications        aspirin EC 81 MG tablet  Take 81 mg by mouth daily.     atorvastatin 10 MG tablet  Commonly known as:  LIPITOR  Take 1 tablet (10 mg total) by mouth daily at 6 PM.     cetirizine 10 MG tablet  Commonly known as:  ZYRTEC  Take 10 mg by mouth daily.     cholecalciferol 1000 UNITS tablet  Commonly known as:  VITAMIN D  Take 1,000 Units by mouth daily.     ferrous sulfate 325 (65 FE) MG tablet  Take 325 mg by mouth daily with breakfast.     hydrochlorothiazide 25 MG tablet  Commonly known as:  HYDRODIURIL  Take 25 mg by mouth daily.     metoprolol succinate 100 MG 24 hr tablet  Commonly known as:  TOPROL-XL  Take 100 mg by mouth daily. Take with or immediately following a meal.     Potassium Chloride ER 20 MEQ Tbcr  Take 10 mEq by mouth daily.     ROLAIDS PO  Take 1 tablet by mouth daily.           Follow-up Information    Follow up with Erasmo Downer, NP. Schedule an appointment as soon as possible for a visit in 2 weeks.   Specialty:  Nurse Practitioner   Contact information:   [address] [place] [address] [phone]  Follow up with [facility], MD. Schedule an appointment as soon as possible for a visit in 1 week.   Specialty:  Cardiology   Contact information:   [address] [place] [address] [phone]       Signed: Ricki Rodriguez 11/18/2014, 5:05 PM

## 2015-05-01 ENCOUNTER — Other Ambulatory Visit: Payer: Self-pay | Admitting: Nurse Practitioner

## 2015-05-01 DIAGNOSIS — Z1231 Encounter for screening mammogram for malignant neoplasm of breast: Secondary | ICD-10-CM

## 2015-05-12 ENCOUNTER — Ambulatory Visit
Admission: RE | Admit: 2015-05-12 | Discharge: 2015-05-12 | Disposition: A | Discharge status: Home / Self Care | Payer: BC Managed Care – PPO | Source: Ambulatory Visit | Attending: Nurse Practitioner | Admitting: Nurse Practitioner

## 2015-05-12 DIAGNOSIS — Z1231 Encounter for screening mammogram for malignant neoplasm of breast: Secondary | ICD-10-CM | POA: Diagnosis not present

## 2015-05-18 ENCOUNTER — Other Ambulatory Visit: Payer: Self-pay | Admitting: Nurse Practitioner

## 2015-05-18 DIAGNOSIS — R928 Other abnormal and inconclusive findings on diagnostic imaging of breast: Secondary | ICD-10-CM

## 2015-06-16 ENCOUNTER — Ambulatory Visit
Admission: RE | Admit: 2015-06-16 | Discharge: 2015-06-16 | Disposition: A | Discharge status: Home / Self Care | Payer: BC Managed Care – PPO | Source: Ambulatory Visit | Attending: Nurse Practitioner | Admitting: Nurse Practitioner

## 2015-06-16 DIAGNOSIS — R928 Other abnormal and inconclusive findings on diagnostic imaging of breast: Secondary | ICD-10-CM | POA: Diagnosis present

## 2016-03-04 ENCOUNTER — Other Ambulatory Visit: Payer: Self-pay | Admitting: Nurse Practitioner

## 2016-03-04 DIAGNOSIS — R519 Headache, unspecified: Secondary | ICD-10-CM

## 2016-03-04 DIAGNOSIS — R51 Headache: Principal | ICD-10-CM

## 2016-03-12 ENCOUNTER — Ambulatory Visit
Admission: RE | Admit: 2016-03-12 | Discharge: 2016-03-12 | Disposition: A | Discharge status: Home / Self Care | Payer: BC Managed Care – PPO | Source: Ambulatory Visit | Attending: Nurse Practitioner | Admitting: Nurse Practitioner

## 2016-03-12 DIAGNOSIS — R519 Headache, unspecified: Secondary | ICD-10-CM

## 2016-03-12 DIAGNOSIS — R51 Headache: Secondary | ICD-10-CM | POA: Diagnosis not present

## 2016-03-12 MED ORDER — IOPAMIDOL (ISOVUE-300) INJECTION 61%
75.0000 mL | Freq: Once | INTRAVENOUS | Status: AC | PRN
  Administered 2016-03-12: 75 mL via INTRAVENOUS

## 2016-10-30 ENCOUNTER — Emergency Department (HOSPITAL_COMMUNITY)
Admission: EM | Admit: 2016-10-30 | Discharge: 2016-10-30 | Disposition: A | Discharge status: Home Health | Payer: BC Managed Care – PPO | Attending: Emergency Medicine | Admitting: Emergency Medicine

## 2016-10-30 ENCOUNTER — Encounter (HOSPITAL_COMMUNITY): Payer: Self-pay | Admitting: Emergency Medicine

## 2016-10-30 DIAGNOSIS — Z79899 Other long term (current) drug therapy: Secondary | ICD-10-CM | POA: Diagnosis not present

## 2016-10-30 DIAGNOSIS — R531 Weakness: Secondary | ICD-10-CM | POA: Diagnosis present

## 2016-10-30 DIAGNOSIS — Z7982 Long term (current) use of aspirin: Secondary | ICD-10-CM | POA: Diagnosis not present

## 2016-10-30 DIAGNOSIS — R55 Syncope and collapse: Secondary | ICD-10-CM | POA: Insufficient documentation

## 2016-10-30 DIAGNOSIS — I10 Essential (primary) hypertension: Secondary | ICD-10-CM | POA: Diagnosis not present

## 2016-10-30 LAB — CBC WITH DIFFERENTIAL/PLATELET
BASOS PCT: 0 %
Basophils Absolute: 0 10*3/uL (ref 0.0–0.1)
Eosinophils Absolute: 0.2 10*3/uL (ref 0.0–0.7)
Eosinophils Relative: 2 %
HEMATOCRIT: 37.3 % (ref 36.0–46.0)
HEMOGLOBIN: 12.4 g/dL (ref 12.0–15.0)
LYMPHS ABS: 1.9 10*3/uL (ref 0.7–4.0)
LYMPHS PCT: 23 %
MCH: 29 pg (ref 26.0–34.0)
MCHC: 33.2 g/dL (ref 30.0–36.0)
MCV: 87.4 fL (ref 78.0–100.0)
MONOS PCT: 4 %
Monocytes Absolute: 0.3 10*3/uL (ref 0.1–1.0)
NEUTROS ABS: 5.8 10*3/uL (ref 1.7–7.7)
NEUTROS PCT: 71 %
Platelets: 230 10*3/uL (ref 150–400)
RBC: 4.27 MIL/uL (ref 3.87–5.11)
RDW: 13.6 % (ref 11.5–15.5)
WBC: 8.3 10*3/uL (ref 4.0–10.5)

## 2016-10-30 LAB — BASIC METABOLIC PANEL
Anion gap: 10 (ref 5–15)
BUN: 13 mg/dL (ref 6–20)
CHLORIDE: 102 mmol/L (ref 101–111)
CO2: 27 mmol/L (ref 22–32)
CREATININE: 0.77 mg/dL (ref 0.44–1.00)
Calcium: 9.6 mg/dL (ref 8.9–10.3)
GFR calc Af Amer: >60 mL/min (ref >60)
GFR calc non Af Amer: >60 mL/min (ref >60)
Glucose, Bld: 126 mg/dL — ABNORMAL HIGH (ref 65–99)
Potassium: 3.3 mmol/L — ABNORMAL LOW (ref 3.5–5.1)
Sodium: 139 mmol/L (ref 135–145)

## 2016-10-30 LAB — TROPONIN I: Troponin I: <0.03 ng/mL (ref <0.03)

## 2016-10-30 MED ORDER — SODIUM CHLORIDE 0.9 % IV BOLUS (SEPSIS)
1000.0000 mL | Freq: Once | INTRAVENOUS | Status: AC
  Administered 2016-10-30: 1000 mL via INTRAVENOUS

## 2016-10-30 NOTE — Discharge Instructions (Signed)
Try to stay well hydrated. Make sure you eat something at least 3 times a day and follow-up with her doctor as planned next week

## 2016-10-30 NOTE — ED Triage Notes (Signed)
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  The cafeteria was hot.  Pt states that she was feeling nauseous on the EMS ride but it has resolved.  VS are WNL.

## 2016-10-30 NOTE — ED Provider Notes (Signed)
[facility] Provider Note   CSN: [id number] Arrival date & time: 10/30/16  1355     History   Chief Complaint Chief Complaint  Patient presents with  . Weakness    HPI Leslie Bentley is a 61 y.o. female.  atient states she was at work today is very hot and she became dizzy and was passed out.She states did not have anything except for some crackers to eat today   The history is provided by the patient. No language interpreter was used.  Weakness  Primary symptoms include no focal weakness. This is a recurrent problem. The current episode started less than 1 hour ago. The problem has been resolved. There was no focality noted. There has been no fever. Pertinent negatives include no shortness of breath, no chest pain and no headaches.    Past Medical History:  Diagnosis Date  . Hypertension     Patient Active Problem List   Diagnosis Date Noted  . Abnormal laboratory test result 11/17/2014  . Acute coronary syndrome (HCC) 11/17/2014    Past Surgical History:  Procedure Laterality Date  . BACK SURGERY    . CHOLECYSTECTOMY    . TONSILLECTOMY      OB History    No data available       Home Medications    Prior to Admission medications   Medication Sig Start Date End Date Taking? Authorizing Provider  amLODipine (NORVASC) 5 MG tablet Take 5 mg by mouth daily.   Yes [provider]  aspirin 81 MG chewable tablet Chew 81 mg by mouth daily.   Yes [provider]  cetirizine (ZYRTEC) 10 MG tablet Take 10 mg by mouth daily.   Yes [provider]  cholecalciferol (VITAMIN D) 1000 UNITS tablet Take 1,000 Units by mouth daily.   Yes [provider]  ferrous sulfate 325 (65 FE) MG tablet Take 325 mg by mouth daily with breakfast.   Yes [provider]  hydrochlorothiazide (HYDRODIURIL) 25 MG tablet Take 25 mg by mouth daily.   Yes [provider]  metoprolol succinate (TOPROL-XL) 100 MG 24 hr tablet Take 100  mg by mouth daily. Take with or immediately following a meal.   Yes [provider]  pantoprazole (PROTONIX) 40 MG tablet Take 40 mg by mouth daily.   Yes [provider]    Family History No family history on file.  Social History Social History  Substance Use Topics  . Smoking status: Never Smoker  . Smokeless tobacco: Never Used  . Alcohol use No     Allergies   Patient has no known allergies.   Review of Systems Review of Systems  Constitutional: Negative for appetite change and fatigue.  HENT: Negative for congestion, ear discharge and sinus pressure.   Eyes: Negative for discharge.  Respiratory: Negative for cough and shortness of breath.   Cardiovascular: Negative for chest pain.  Gastrointestinal: Negative for abdominal pain and diarrhea.  Genitourinary: Negative for frequency and hematuria.  Musculoskeletal: Negative for back pain.  Skin: Negative for rash.  Neurological: Positive for weakness. Negative for focal weakness, seizures and headaches.  Psychiatric/Behavioral: Negative for hallucinations.     Physical Exam Updated Vital Signs BP 117/70   Pulse 73   Temp 98.2 F (36.8 C) (Oral)   Resp 20   Ht 5\' 4"  (1.626 m)   Wt 65.8 kg (145 lb)   SpO2 99%   BMI 24.89 kg/m   Physical Exam  Constitutional: She is  oriented to person, place, and time. She appears well-developed.  HENT:  Head: Normocephalic.  Eyes: Conjunctivae and EOM are normal. No scleral icterus.  Neck: Neck supple. No thyromegaly present.  Cardiovascular: Normal rate and regular rhythm.  Exam reveals no gallop and no friction rub.   No murmur heard. Pulmonary/Chest: No stridor. She has no wheezes. She has no rales. She exhibits no tenderness.  Abdominal: She exhibits no distension. There is no tenderness. There is no rebound.  Musculoskeletal: Normal range of motion. She exhibits no edema.  Lymphadenopathy:    She has no cervical adenopathy.  Neurological: She is  oriented to person, place, and time. She exhibits normal muscle tone. Coordination normal.  Skin: No rash noted. No erythema.  Psychiatric: She has a normal mood and affect. Her behavior is normal.     ED Treatments / Results  Labs (all labs ordered are listed, but only abnormal results are displayed) Labs Reviewed  BASIC METABOLIC PANEL - Abnormal; Notable for the following:       Result Value   Potassium 3.3 (*)    Glucose, Bld 126 (*)    All other components within normal limits  CBC WITH DIFFERENTIAL/PLATELET  TROPONIN I    EKG  EKG Interpretation None       Radiology No results found.  Procedures Procedures (including critical care time)  Medications Ordered in ED Medications  sodium chloride 0.9 % bolus 1,000 mL (1,000 mLs Intravenous New Bag/Given 10/30/16 1418)     Initial Impression / Assessment and Plan / ED Course  I have reviewed the triage vital signs and the nursing notes.  Pertinent labs & imaging results that were available during my care of the patient were reviewed by me and considered in my medical decision making (see chart for details).     lAb work and EKG unremarkable. Patient feels back to normal now. Suspect dizziness and weakness was all related to dehydration and not eating today  Final Clinical Impressions(s) / ED Diagnoses   Final diagnoses:  Near syncope    New Prescriptions New Prescriptions   No medications on file     Bethann Berkshire, MD 10/30/16 1505

## 2016-11-07 ENCOUNTER — Encounter (HOSPITAL_COMMUNITY): Payer: Self-pay | Admitting: Emergency Medicine

## 2016-11-07 ENCOUNTER — Emergency Department (HOSPITAL_COMMUNITY)
Admission: EM | Admit: 2016-11-07 | Discharge: 2016-11-07 | Disposition: A | Discharge status: Home / Self Care | Payer: Worker's Compensation | Attending: Emergency Medicine | Admitting: Emergency Medicine

## 2016-11-07 ENCOUNTER — Emergency Department (HOSPITAL_COMMUNITY): Payer: Worker's Compensation

## 2016-11-07 DIAGNOSIS — M129 Arthropathy, unspecified: Secondary | ICD-10-CM | POA: Diagnosis not present

## 2016-11-07 DIAGNOSIS — W19XXXA Unspecified fall, initial encounter: Secondary | ICD-10-CM | POA: Diagnosis not present

## 2016-11-07 DIAGNOSIS — Y9389 Activity, other specified: Secondary | ICD-10-CM | POA: Diagnosis not present

## 2016-11-07 DIAGNOSIS — W01198A Fall on same level from slipping, tripping and stumbling with subsequent striking against other object, initial encounter: Secondary | ICD-10-CM | POA: Insufficient documentation

## 2016-11-07 DIAGNOSIS — R51 Headache: Secondary | ICD-10-CM | POA: Diagnosis not present

## 2016-11-07 DIAGNOSIS — S0990XA Unspecified injury of head, initial encounter: Secondary | ICD-10-CM | POA: Insufficient documentation

## 2016-11-07 DIAGNOSIS — S5001XA Contusion of right elbow, initial encounter: Secondary | ICD-10-CM | POA: Insufficient documentation

## 2016-11-07 DIAGNOSIS — S199XXA Unspecified injury of neck, initial encounter: Secondary | ICD-10-CM | POA: Insufficient documentation

## 2016-11-07 DIAGNOSIS — Y929 Unspecified place or not applicable: Secondary | ICD-10-CM | POA: Insufficient documentation

## 2016-11-07 DIAGNOSIS — I10 Essential (primary) hypertension: Secondary | ICD-10-CM | POA: Diagnosis not present

## 2016-11-07 DIAGNOSIS — Z79899 Other long term (current) drug therapy: Secondary | ICD-10-CM | POA: Diagnosis not present

## 2016-11-07 DIAGNOSIS — Y998 Other external cause status: Secondary | ICD-10-CM | POA: Insufficient documentation

## 2016-11-07 DIAGNOSIS — S59901A Unspecified injury of right elbow, initial encounter: Secondary | ICD-10-CM | POA: Diagnosis present

## 2016-11-07 MED ORDER — HYDROCODONE-ACETAMINOPHEN 5-325 MG PO TABS: 1.0000 | ORAL_TABLET | Freq: Four times a day (QID) | ORAL | 0 refills | Status: DC | PRN

## 2016-11-07 NOTE — Discharge Instructions (Signed)
CT of head and neck without any acute findings. X-rays of the right elbow without any bony injuries. Where the sling to support the arm as needed. Follow-up with orthopedics if not improving over the next few days. Take pain medicine as needed.

## 2016-11-07 NOTE — ED Triage Notes (Signed)
Slipped on water at work and fell.  C/o rt elbow pain.  Co workers stated that pt fell and hit back of head.

## 2016-11-07 NOTE — ED Provider Notes (Signed)
[facility] Provider Note   CSN: [id number] Arrival date & time: 11/07/16  1330     History   Chief Complaint Chief Complaint  Patient presents with  . Fall    HPI Leslie Bentley is a 61 y.o. female.  Patient works for the school system. She slipped on a wet floor falling backwards striking her right elbow and the back of her head on the floor. No loss of consciousness. Patient to the injury occurred at 11:30 this morning. Patient does have some slight neck discomfort. But most of the pain is to the right elbow. Denies any chest pain abdominal pain low back pain or extremity pain other than the right elbow. Injury did occur on the job.      Past Medical History:  Diagnosis Date  . Hypertension     Patient Active Problem List   Diagnosis Date Noted  . Abnormal laboratory test result 11/17/2014  . Acute coronary syndrome (HCC) 11/17/2014    Past Surgical History:  Procedure Laterality Date  . BACK SURGERY    . CHOLECYSTECTOMY    . TONSILLECTOMY      OB History    No data available       Home Medications    Prior to Admission medications   Medication Sig Start Date End Date Taking? Authorizing Provider  amLODipine (NORVASC) 5 MG tablet Take 5 mg by mouth daily.   Yes [provider]  aspirin 81 MG chewable tablet Chew 81 mg by mouth daily.   Yes [provider]  cetirizine (ZYRTEC) 10 MG tablet Take 10 mg by mouth daily.   Yes [provider]  cholecalciferol (VITAMIN D) 1000 UNITS tablet Take 1,000 Units by mouth daily.   Yes [provider]  ferrous sulfate 325 (65 FE) MG tablet Take 325 mg by mouth daily with breakfast.   Yes [provider]  hydrochlorothiazide (HYDRODIURIL) 25 MG tablet Take 25 mg by mouth daily.   Yes [provider]  metoprolol succinate (TOPROL-XL) 100 MG 24 hr tablet Take 100 mg by mouth daily. Take with or immediately following a meal.   Yes [provider]    pantoprazole (PROTONIX) 40 MG tablet Take 40 mg by mouth daily.   Yes [provider]    Family History No family history on file.  Social History Social History  Substance Use Topics  . Smoking status: Never Smoker  . Smokeless tobacco: Never Used  . Alcohol use No     Allergies   Patient has no known allergies.   Review of Systems Review of Systems  Constitutional: Negative for fever.  HENT: Negative for congestion.   Eyes: Negative for visual disturbance.  Respiratory: Negative for shortness of breath.   Cardiovascular: Negative for chest pain.  Gastrointestinal: Negative for abdominal pain.  Genitourinary: Negative for dysuria.  Musculoskeletal: Positive for joint swelling and neck pain. Negative for back pain.  Skin: Negative for rash.  Neurological: Positive for headaches.  Hematological: Does not bruise/bleed easily.  Psychiatric/Behavioral: Negative for confusion.     Physical Exam Updated Vital Signs BP 109/65 (BP Location: Left Arm)   Pulse 65   Temp 98.4 F (36.9 C) (Oral)   Resp 18   Ht 1.575 m ( )   Wt 65.8 kg (145 lb)   SpO2 97%   BMI 26.52 kg/m   Physical Exam  Constitutional: She is oriented to person, place, and time. She appears well-developed and well-nourished. No distress.  HENT:  Head: Normocephalic.  Mouth/Throat: Oropharynx is clear and moist.  Eyes: Pupils are equal, round, and reactive to light. Conjunctivae and EOM are normal.  Neck: Normal range of motion.  Slight tenderness to palpation to the base of left side of the neck.  Cardiovascular: Normal rate, regular rhythm and normal heart sounds.   Pulmonary/Chest: Effort normal and breath sounds normal.  Abdominal: Soft. Bowel sounds are normal.  Musculoskeletal: She exhibits tenderness.  Decreased range of motion of the right elbow. Slight swelling. No deformity. Radial pulse distally 2+ sensation intact. Good range of motion of shoulder and wrist and  fingers  Otherwise extremities without concern for traumatic injury.  Neurological: She is alert and oriented to person, place, and time. No cranial nerve deficit or sensory deficit. She exhibits normal muscle tone. Coordination normal.  Skin: Skin is warm.  Nursing note and vitals reviewed.    ED Treatments / Results  Labs (all labs ordered are listed, but only abnormal results are displayed) Labs Reviewed - No data to display  EKG  EKG Interpretation None       Radiology Dg Elbow Complete Right  Result Date: 11/07/2016 CLINICAL DATA:  Slip and fall today with elbow pain, initial encounter EXAM: RIGHT ELBOW - COMPLETE 3+ VIEW COMPARISON:  None. FINDINGS: There is no evidence of fracture, dislocation, or joint effusion. There is no evidence of arthropathy or other focal bone abnormality. Soft tissues are unremarkable. IMPRESSION: No acute abnormality noted. Electronically Signed   By: Alcide CleverMark  Lukens M.D.   On: 11/07/2016 14:50   Ct Head Wo Contrast  Result Date: 11/07/2016 CLINICAL DATA:  Pain following fall EXAM: CT HEAD WITHOUT CONTRAST CT CERVICAL SPINE WITHOUT CONTRAST TECHNIQUE: Multidetector CT imaging of the head and cervical spine was performed following the standard protocol without intravenous contrast. Multiplanar CT image reconstructions of the cervical spine were also generated. COMPARISON:  Head CT March 12, 2016 FINDINGS: CT HEAD FINDINGS Brain: The ventricles are normal in size and configuration. There is no intracranial mass, hemorrhage, extra-axial fluid collection, or midline shift. Gray-white compartments appear normal. No evident acute infarct. Vascular: No hyperdense vessel. There is no appreciable vascular calcification. Skull: The bony calvarium appears intact. Sinuses/Orbits: There is mucosal thickening in several ethmoid air cells. Other paranasal sinuses which are visualized are clear. Visualized orbits appear symmetric bilaterally. Other: Mastoid air cells are  clear. CT CERVICAL SPINE FINDINGS Alignment: There is no appreciable spondylolisthesis. Skull base and vertebrae: Skull base and craniocervical junction regions appear normal. The patient is status post anterior screw and plate fixation at C6 and C7 with support hardware intact. There is no evident fracture. There are no blastic or lytic bone lesions. Soft tissues and spinal canal: Prevertebral soft tissues and predental space regions are normal. There are no paraspinous lesions. There is no evident cord or canal hematoma. Disc levels: There is ankylosis at C6-7. There is moderate disc space narrowing at C5-6. There is slight disc space narrowing at C3-4 and C4-5. There are prominent anterior osteophytes at C3, C4, and C5 with calcification in the anterior ligament at C3-4. There is mild posterior longitudinal ligament calcification at C4 and C5. There is facet hypertrophy at several levels bilaterally. There is mild impression on the ventral cord at C5-6 due to posterior longitudinal ligament calcification. No disc extrusion or high-grade stenosis evident. Upper chest: Visualized upper lung regions are clear. Other: None IMPRESSION: CT head: No intracranial mass, hemorrhage, or extra-axial fluid collection. Gray-white compartments are normal. There is mild  ethmoid sinus disease. CT cervical spine: No fracture or spondylolisthesis. Postoperative change at C6 and C7 with support hardware intact. 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 No disc extrusion or high-grade stenosis evident. Electronically Signed   By: Bretta Bang III M.D.   On: 11/07/2016 14:46   Ct Cervical Spine Wo Contrast  Result Date: 11/07/2016 CLINICAL DATA:  Pain following fall EXAM: CT HEAD WITHOUT CONTRAST CT CERVICAL SPINE WITHOUT CONTRAST TECHNIQUE: Multidetector CT imaging of the head and cervical spine was performed following the standard protocol without intravenous contrast. Multiplanar CT image reconstructions of the  cervical spine were also generated. COMPARISON:  Head CT March 12, 2016 FINDINGS: CT HEAD FINDINGS Brain: The ventricles are normal in size and configuration. There is no intracranial mass, hemorrhage, extra-axial fluid collection, or midline shift. Gray-white compartments appear normal. No evident acute infarct. Vascular: No hyperdense vessel. There is no appreciable vascular calcification. Skull: The bony calvarium appears intact. Sinuses/Orbits: There is mucosal thickening in several ethmoid air cells. Other paranasal sinuses which are visualized are clear. Visualized orbits appear symmetric bilaterally. Other: Mastoid air cells are clear. CT CERVICAL SPINE FINDINGS Alignment: There is no appreciable spondylolisthesis. Skull base and vertebrae: Skull base and craniocervical junction regions appear normal. The patient is status post anterior screw and plate fixation at C6 and C7 with support hardware intact. There is no evident fracture. There are no blastic or lytic bone lesions. Soft tissues and spinal canal: Prevertebral soft tissues and predental space regions are normal. There are no paraspinous lesions. There is no evident cord or canal hematoma. Disc levels: There is ankylosis at C6-7. There is moderate disc space narrowing at C5-6. There is slight disc space narrowing at C3-4 and C4-5. There are prominent anterior osteophytes at C3, C4, and C5 with calcification in the anterior ligament at C3-4. There is mild posterior longitudinal ligament calcification at C4 and C5. There is facet hypertrophy at several levels bilaterally. There is mild impression on the ventral cord at C5-6 due to posterior longitudinal ligament calcification. No disc extrusion or high-grade stenosis evident. Upper chest: Visualized upper lung regions are clear. Other: None IMPRESSION: CT head: No intracranial mass, hemorrhage, or extra-axial fluid collection. Gray-white compartments are normal. There is mild ethmoid sinus disease. CT  cervical spine: No fracture or spondylolisthesis. Postoperative change at C6 and C7 with support hardware intact. 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 No disc extrusion or high-grade stenosis evident. Electronically Signed   By: Bretta Bang III M.D.   On: 11/07/2016 14:46    Procedures Procedures (including critical care time)  Medications Ordered in ED Medications - No data to display   Initial Impression / Assessment and Plan / ED Course  I have reviewed the triage vital signs and the nursing notes.  Pertinent labs & imaging results that were available during my care of the patient were reviewed by me and considered in my medical decision making (see chart for details).    Patient with fall at work. Slipping on wet floor falling back onto her right elbow and striking her head. CT head and neck without any acute findings. X-rays of the right elbow without any bony injuries. Patient does have some slight swelling to the right elbow and tenderness. Will be treated with a sling and orthopedic follow-up if needed. Patient will be out of school until Monday. So work note not currently required. Patient treated with hydrocodone for the pain.  Patient did not have any workplace injury forms  to fill out.   Final Clinical Impressions(s) / ED Diagnoses   Final diagnoses:  Fall, initial encounter  Contusion of right elbow, initial encounter  Injury of head, initial encounter    New Prescriptions New Prescriptions   No medications on file     Vanetta Mulders, MD 11/07/16 1614

## 2017-10-08 ENCOUNTER — Other Ambulatory Visit: Payer: Self-pay | Admitting: Nurse Practitioner

## 2017-10-08 DIAGNOSIS — N644 Mastodynia: Secondary | ICD-10-CM

## 2017-11-19 ENCOUNTER — Ambulatory Visit
Admission: RE | Admit: 2017-11-19 | Discharge: 2017-11-19 | Disposition: A | Discharge status: Home / Self Care | Payer: BC Managed Care – PPO | Source: Ambulatory Visit | Attending: Nurse Practitioner | Admitting: Nurse Practitioner

## 2017-11-19 DIAGNOSIS — N644 Mastodynia: Secondary | ICD-10-CM | POA: Insufficient documentation

## 2017-12-18 ENCOUNTER — Emergency Department (HOSPITAL_COMMUNITY)
Admission: EM | Admit: 2017-12-18 | Discharge: 2017-12-18 | Disposition: A | Discharge status: Home / Self Care | Payer: BC Managed Care – PPO | Attending: Emergency Medicine | Admitting: Emergency Medicine

## 2017-12-18 ENCOUNTER — Emergency Department (HOSPITAL_COMMUNITY): Payer: BC Managed Care – PPO

## 2017-12-18 ENCOUNTER — Encounter (HOSPITAL_COMMUNITY): Payer: Self-pay | Admitting: *Deleted

## 2017-12-18 ENCOUNTER — Other Ambulatory Visit: Payer: Self-pay

## 2017-12-18 DIAGNOSIS — Z79899 Other long term (current) drug therapy: Secondary | ICD-10-CM | POA: Diagnosis not present

## 2017-12-18 DIAGNOSIS — R079 Chest pain, unspecified: Secondary | ICD-10-CM | POA: Diagnosis present

## 2017-12-18 DIAGNOSIS — R0789 Other chest pain: Secondary | ICD-10-CM | POA: Insufficient documentation

## 2017-12-18 DIAGNOSIS — I10 Essential (primary) hypertension: Secondary | ICD-10-CM | POA: Diagnosis not present

## 2017-12-18 DIAGNOSIS — E876 Hypokalemia: Secondary | ICD-10-CM | POA: Diagnosis not present

## 2017-12-18 LAB — I-STAT TROPONIN, ED: TROPONIN I, POC: 0 ng/mL (ref 0.00–0.08)

## 2017-12-18 LAB — CBC
HEMATOCRIT: 38.6 % (ref 36.0–46.0)
HEMOGLOBIN: 12 g/dL (ref 12.0–15.0)
MCH: 28.1 pg (ref 26.0–34.0)
MCHC: 31.1 g/dL (ref 30.0–36.0)
MCV: 90.4 fL (ref 80.0–100.0)
Platelets: 277 10*3/uL (ref 150–400)
RBC: 4.27 MIL/uL (ref 3.87–5.11)
RDW: 13.2 % (ref 11.5–15.5)
WBC: 9.8 10*3/uL (ref 4.0–10.5)
nRBC: 0 % (ref 0.0–0.2)

## 2017-12-18 LAB — COMPREHENSIVE METABOLIC PANEL
ALT: 21 U/L (ref 0–44)
AST: 22 U/L (ref 15–41)
Albumin: 4.4 g/dL (ref 3.5–5.0)
Alkaline Phosphatase: 80 U/L (ref 38–126)
Anion gap: 9 (ref 5–15)
BUN: 15 mg/dL (ref 8–23)
CO2: 28 mmol/L (ref 22–32)
Calcium: 9.5 mg/dL (ref 8.9–10.3)
Chloride: 102 mmol/L (ref 98–111)
Creatinine, Ser: 0.78 mg/dL (ref 0.44–1.00)
Glucose, Bld: 101 mg/dL — ABNORMAL HIGH (ref 70–99)
POTASSIUM: 2.9 mmol/L — AB (ref 3.5–5.1)
SODIUM: 139 mmol/L (ref 135–145)
Total Bilirubin: 0.6 mg/dL (ref 0.3–1.2)
Total Protein: 8.6 g/dL — ABNORMAL HIGH (ref 6.5–8.1)

## 2017-12-18 LAB — TROPONIN I: Troponin I: <0.03 ng/mL (ref <0.03)

## 2017-12-18 LAB — LIPASE, BLOOD: LIPASE: 31 U/L (ref 11–51)

## 2017-12-18 LAB — D-DIMER, QUANTITATIVE (NOT AT ARMC): D DIMER QUANT: 1.31 ug{FEU}/mL — AB (ref 0.00–0.50)

## 2017-12-18 MED ORDER — POTASSIUM CHLORIDE CRYS ER 20 MEQ PO TBCR
40.0000 meq | EXTENDED_RELEASE_TABLET | Freq: Once | ORAL | Status: AC
  Administered 2017-12-18: 40 meq via ORAL
  Filled 2017-12-18: qty 2

## 2017-12-18 MED ORDER — POTASSIUM CHLORIDE CRYS ER 20 MEQ PO TBCR: 20.0000 meq | EXTENDED_RELEASE_TABLET | Freq: Every day | ORAL | 0 refills | Status: AC

## 2017-12-18 MED ORDER — IOPAMIDOL (ISOVUE-370) INJECTION 76%
100.0000 mL | Freq: Once | INTRAVENOUS | Status: AC | PRN
  Administered 2017-12-18: 100 mL via INTRAVENOUS

## 2017-12-18 NOTE — ED Triage Notes (Signed)
Chest pain and lightheaded onset 12 pm, also has heartburn

## 2017-12-18 NOTE — ED Provider Notes (Signed)
[facility] EMERGENCY DEPARTMENT Provider Note   CSN: [id number] Arrival date & time: 12/18/17  1453     History   Chief Complaint Chief Complaint  Patient presents with  . Chest Pain    HPI Leslie Bentley is a 62 y.o. female.  HPI Patient states she was having burning epigastric pain that started around noon today.  She then experience sharp left-sided chest pain which was associated with nausea and lightheadedness.  Was seen by the nurse at her job site and referred to the urgent care.  Urgent care referred to the emergency department for cardiac evaluation.  Patient states in route she had 2 more episodes of sharp left-sided chest pain which resolved spontaneously.  She currently is denying any symptoms.  Denies any shortness of breath at any point cough.  Denies family history of coronary artery disease.  No personal family history of PE/DVT. Past Medical History:  Diagnosis Date  . Hypertension     Patient Active Problem List   Diagnosis Date Noted  . Abnormal laboratory test result 11/17/2014  . Acute coronary syndrome (HCC) 11/17/2014    Past Surgical History:  Procedure Laterality Date  . BACK SURGERY    . CHOLECYSTECTOMY    . TONSILLECTOMY       OB History   None      Home Medications    Prior to Admission medications   Medication Sig Start Date End Date Taking? Authorizing Provider  amLODipine (NORVASC) 5 MG tablet Take 5 mg by mouth every morning.    Yes [provider]  aspirin 81 MG chewable tablet Chew 81 mg by mouth at bedtime.    Yes [provider]  cetirizine (ZYRTEC) 10 MG tablet Take 10 mg by mouth at bedtime.    Yes [provider]  cholecalciferol (VITAMIN D) 1000 UNITS tablet Take 1,000 Units by mouth daily.   Yes [provider]  hydrochlorothiazide (HYDRODIURIL) 25 MG tablet Take 25 mg by mouth every morning.    Yes [provider]  metoprolol succinate (TOPROL-XL) 100 MG 24 hr tablet Take  100 mg by mouth at bedtime.    Yes [provider]  pantoprazole (PROTONIX) 40 MG tablet Take 40 mg by mouth every morning.    Yes [provider]  Propylene Glycol (SYSTANE BALANCE) 0.6 % SOLN Place 1-2 drops into the left eye daily as needed (for irritation).   Yes [provider]  potassium chloride SA (K-DUR,KLOR-CON) 20 MEQ tablet Take 1 tablet (20 mEq total) by mouth daily. 12/19/17   Loren Racer, MD    Family History No family history on file.  Social History Social History   Tobacco Use  . Smoking status: Never Smoker  . Smokeless tobacco: Never Used  Substance Use Topics  . Alcohol use: No  . Drug use: No     Allergies   Patient has no known allergies.   Review of Systems Review of Systems  Constitutional: Negative for chills, fatigue and fever.  HENT: Negative for sore throat and trouble swallowing.   Eyes: Negative for visual disturbance.  Respiratory: Negative for cough and shortness of breath.   Cardiovascular: Positive for chest pain. Negative for palpitations and leg swelling.  Gastrointestinal: Positive for abdominal pain and nausea. Negative for constipation, diarrhea and vomiting.  Genitourinary: Negative for dysuria, flank pain and frequency.  Musculoskeletal: Negative for back pain, myalgias and neck pain.  Skin: Negative for rash and wound.  Neurological: Positive for dizziness and  light-headedness. Negative for syncope, weakness, numbness and headaches.  All other systems reviewed and are negative.    Physical Exam Updated Vital Signs BP 113/66 (BP Location: Right Arm)   Pulse 74   Temp 98 F (36.7 C) (Oral)   Resp 12   Ht 5\' 2"  (1.575 m)   Wt (!) 144.5 kg   SpO2 98%   BMI 58.27 kg/m   Physical Exam  Constitutional: She is oriented to person, place, and time. She appears well-developed and well-nourished. No distress.  HENT:  Head: Normocephalic and atraumatic.  Mouth/Throat: Oropharynx is clear and moist.  No oropharyngeal exudate.  Eyes: Pupils are equal, round, and reactive to light. EOM are normal.  No nystagmus  Neck: Normal range of motion. Neck supple. No JVD present.  Cardiovascular: Normal rate and regular rhythm. Exam reveals no gallop and no friction rub.  No murmur heard. Pulmonary/Chest: Effort normal and breath sounds normal. No stridor. No respiratory distress. She has no wheezes. She has no rales. She exhibits no tenderness.  Abdominal: Soft. Bowel sounds are normal. She exhibits no distension and no mass. There is tenderness. There is no rebound and no guarding. No hernia.  Very mild right lower quadrant tenderness to palpation without rebound or guarding.  Musculoskeletal: Normal range of motion. She exhibits no edema or tenderness.  No CVA tenderness.  No lower extremity swelling, asymmetry or tenderness.  Distal pulses are 2+.  No midline thoracic or lumbar tenderness.  No CVA tenderness.  Lymphadenopathy:    She has no cervical adenopathy.  Neurological: She is alert and oriented to person, place, and time.  Moves all extremities without focal deficit.  Sensation fully intact.  Skin: Skin is warm and dry. Capillary refill takes less than 2 seconds. No rash noted. She is not diaphoretic. No erythema.  Psychiatric: She has a normal mood and affect. Her behavior is normal.  Nursing note and vitals reviewed.    ED Treatments / Results  Labs (all labs ordered are listed, but only abnormal results are displayed) Labs Reviewed  COMPREHENSIVE METABOLIC PANEL - Abnormal; Notable for the following components:      Result Value   Potassium 2.9 (*)    Glucose, Bld 101 (*)    Total Protein 8.6 (*)    All other components within normal limits  D-DIMER, QUANTITATIVE (NOT AT [facility]) - Abnormal; Notable for the following components:   D-Dimer, Quant 1.31 (*)    All other components within normal limits  CBC  LIPASE, BLOOD  TROPONIN I  I-STAT TROPONIN, ED    EKG EKG  Interpretation  Date/Time:  Thursday December 18 2017 15:20:43 EDT Ventricular Rate:  70 PR Interval:    QRS Duration: 84 QT Interval:  370 QTC Calculation: 400 R Axis:   52 Text Interpretation:  Sinus rhythm Consider left atrial enlargement No significant change since last tracing Confirmed by Loren Racer ([id number]) on 12/18/2017 3:33:48 PM   Radiology Dg Chest 2 View  Result Date: 12/18/2017 CLINICAL DATA:  Acute chest pain today. EXAM: CHEST - 2 VIEW COMPARISON:  11/17/2014 and prior chest radiographs FINDINGS: The cardiomediastinal silhouette is unremarkable. There is no evidence of focal airspace disease, pulmonary edema, suspicious pulmonary nodule/mass, pleural effusion, or pneumothorax. No acute bony abnormalities are identified. IMPRESSION: No active cardiopulmonary disease. Electronically Signed   By: Harmon Pier M.D.   On: 12/18/2017 16:29   Ct Angio Chest Pe W And/or Wo Contrast  Result Date: 12/18/2017 CLINICAL DATA:  Chest pain,  shortness of breath, lightheadedness EXAM: CT ANGIOGRAPHY CHEST WITH CONTRAST TECHNIQUE: Multidetector CT imaging of the chest was performed using the standard protocol during bolus administration of intravenous contrast. Multiplanar CT image reconstructions and MIPs were obtained to evaluate the vascular anatomy. CONTRAST:  ISOVUE-370 IOPAMIDOL (ISOVUE-370) INJECTION 76% COMPARISON:  None. FINDINGS: Cardiovascular: Heart size normal. No pericardial effusion. Satisfactory opacification of pulmonary arteries noted, and there is no evidence of pulmonary emboli. Adequate contrast opacification of the thoracic aorta with no evidence of dissection, aneurysm, or stenosis. There is classic 3-vessel brachiocephalic arch anatomy without proximal stenosis. Visualized proximal abdominal aorta unremarkable. Mediastinum/Nodes: No hilar or mediastinal adenopathy. Lungs/Pleura: No pleural effusion. No pneumothorax. Linear scarring or subsegmental atelectasis  posteriorly in the left lower lobe. Lungs otherwise clear. Upper Abdomen: Cholecystectomy clips.  No acute findings. Musculoskeletal: Cervical fixation hardware partially visualized. No fracture or acute bone abnormality. Review of the MIP images confirms the above findings. IMPRESSION: 1. Negative for acute PE or thoracic aortic dissection. Electronically Signed   By: Corlis Leak M.D.   On: 12/18/2017 17:35    Procedures Procedures (including critical care time)  Medications Ordered in ED Medications  potassium chloride SA (K-DUR,KLOR-CON) CR tablet 40 mEq (40 mEq Oral Given 12/18/17 1736)  iopamidol (ISOVUE-370) 76 % injection 100 mL (100 mLs Intravenous Contrast Given 12/18/17 1722)     Initial Impression / Assessment and Plan / ED Course  I have reviewed the triage vital signs and the nursing notes.  Pertinent labs & imaging results that were available during my care of the patient were reviewed by me and considered in my medical decision making (see chart for details).     Patient is pain-free.  Chest pain is atypical for coronary artery disease.  EKG without ischemic findings.  Troponin x2 is negative.  Patient does have elevated d-dimer.  CT angio chest without evidence of PE or other abnormalities.  Patient did have low potassium which was orally replaced in the emergency department.  Advised against acidic and spicy foods.  She will continue PPI.  Also follow-up with her cardiologist.  Return precautions have been given.  Final Clinical Impressions(s) / ED Diagnoses   Final diagnoses:  Atypical chest pain  Hypokalemia    ED Discharge Orders         Ordered    potassium chloride SA (K-DUR,KLOR-CON) 20 MEQ tablet  Daily     12/18/17 1951           Loren Racer, MD 12/18/17 1953

## 2017-12-18 NOTE — ED Notes (Signed)
Patient assisted to bathroom 

## 2018-03-27 ENCOUNTER — Emergency Department (HOSPITAL_COMMUNITY): Payer: BC Managed Care – PPO

## 2018-03-27 ENCOUNTER — Encounter (HOSPITAL_COMMUNITY): Payer: Self-pay

## 2018-03-27 ENCOUNTER — Other Ambulatory Visit: Payer: Self-pay

## 2018-03-27 ENCOUNTER — Emergency Department (HOSPITAL_COMMUNITY)
Admission: EM | Admit: 2018-03-27 | Discharge: 2018-03-27 | Disposition: A | Discharge status: Home / Self Care | Payer: BC Managed Care – PPO | Attending: Emergency Medicine | Admitting: Emergency Medicine

## 2018-03-27 DIAGNOSIS — Y9301 Activity, walking, marching and hiking: Secondary | ICD-10-CM | POA: Insufficient documentation

## 2018-03-27 DIAGNOSIS — Y92009 Unspecified place in unspecified non-institutional (private) residence as the place of occurrence of the external cause: Secondary | ICD-10-CM | POA: Insufficient documentation

## 2018-03-27 DIAGNOSIS — Z7982 Long term (current) use of aspirin: Secondary | ICD-10-CM | POA: Diagnosis not present

## 2018-03-27 DIAGNOSIS — I10 Essential (primary) hypertension: Secondary | ICD-10-CM | POA: Diagnosis not present

## 2018-03-27 DIAGNOSIS — S60222A Contusion of left hand, initial encounter: Secondary | ICD-10-CM | POA: Diagnosis not present

## 2018-03-27 DIAGNOSIS — Y999 Unspecified external cause status: Secondary | ICD-10-CM | POA: Diagnosis not present

## 2018-03-27 DIAGNOSIS — S0083XA Contusion of other part of head, initial encounter: Secondary | ICD-10-CM | POA: Diagnosis not present

## 2018-03-27 DIAGNOSIS — S0993XA Unspecified injury of face, initial encounter: Secondary | ICD-10-CM | POA: Diagnosis present

## 2018-03-27 DIAGNOSIS — Z79899 Other long term (current) drug therapy: Secondary | ICD-10-CM | POA: Diagnosis not present

## 2018-03-27 DIAGNOSIS — W109XXA Fall (on) (from) unspecified stairs and steps, initial encounter: Secondary | ICD-10-CM | POA: Diagnosis not present

## 2018-03-27 NOTE — ED Triage Notes (Signed)
Pt was going out of her front step and fell on her sidewalk. Fall happened at 5:30 am. Is having wrist pain, nose pain, and a scrape to her left knee. Slight pain to left knee.

## 2018-03-27 NOTE — Discharge Instructions (Addendum)
Return if any problems.  See your Physician for recheck in 2-3 days °

## 2018-03-28 NOTE — ED Provider Notes (Signed)
[facility] EMERGENCY DEPARTMENT Provider Note   CSN: [id number] Arrival date & time: 03/27/18  1105     History   Chief Complaint Chief Complaint  Patient presents with  . Fall    HPI Leslie Bentley is a 63 y.o. female.  The history is provided by the patient. No language interpreter was used.  Fall  This is a new problem. The current episode started 1 to 2 hours ago. The problem has not changed since onset.Nothing aggravates the symptoms. Nothing relieves the symptoms. She has tried nothing for the symptoms. The treatment provided no relief.   Pt fell and hit her nose.  Pt reports she has pain in her nose and her left hand.  Pt reports soreness in her left knee Past Medical History:  Diagnosis Date  . Hypertension     Patient Active Problem List   Diagnosis Date Noted  . Abnormal laboratory test result 11/17/2014  . Acute coronary syndrome (HCC) 11/17/2014    Past Surgical History:  Procedure Laterality Date  . BACK SURGERY    . CHOLECYSTECTOMY    . TONSILLECTOMY       OB History   No obstetric history on file.      Home Medications    Prior to Admission medications   Medication Sig Start Date End Date Taking? Authorizing Provider  amLODipine (NORVASC) 5 MG tablet Take 5 mg by mouth every morning.     [provider]  aspirin 81 MG chewable tablet Chew 81 mg by mouth at bedtime.     [provider]  cetirizine (ZYRTEC) 10 MG tablet Take 10 mg by mouth at bedtime.     [provider]  cholecalciferol (VITAMIN D) 1000 UNITS tablet Take 1,000 Units by mouth daily.    [provider]  hydrochlorothiazide (HYDRODIURIL) 25 MG tablet Take 25 mg by mouth every morning.     [provider]  metoprolol succinate (TOPROL-XL) 100 MG 24 hr tablet Take 100 mg by mouth at bedtime.     [provider]  pantoprazole (PROTONIX) 40 MG tablet Take 40 mg by mouth every morning.     [provider]  potassium  chloride SA (K-DUR,KLOR-CON) 20 MEQ tablet Take 1 tablet (20 mEq total) by mouth daily. 12/19/17   Loren Racer, MD  Propylene Glycol (SYSTANE BALANCE) 0.6 % SOLN Place 1-2 drops into the left eye daily as needed (for irritation).    [provider]    Family History No family history on file.  Social History Social History   Tobacco Use  . Smoking status: Never Smoker  . Smokeless tobacco: Never Used  Substance Use Topics  . Alcohol use: No  . Drug use: No     Allergies   Patient has no known allergies.   Review of Systems Review of Systems  All other systems reviewed and are negative.    Physical Exam Updated Vital Signs BP 133/64 (BP Location: Right Arm)   Pulse 70   Temp 97.8 F (36.6 C) (Oral)   Resp 16   Ht 5\' 2"  (1.575 m)   Wt 67.1 kg   SpO2 99%   BMI 27.07 kg/m   Physical Exam Vitals signs and nursing note reviewed.  Constitutional:      Appearance: She is well-developed.  HENT:     Head: Normocephalic.     Right Ear: Tympanic membrane normal.     Nose: Nose normal.     Comments: Bruised mid  nose,   Eyes:     Pupils: Pupils are equal, round, and reactive to light.  Neck:     Musculoskeletal: Normal range of motion.  Cardiovascular:     Rate and Rhythm: Normal rate.  Pulmonary:     Effort: Pulmonary effort is normal.  Abdominal:     General: There is no distension.  Musculoskeletal: Normal range of motion.     Comments: Tender left hand from  nv and ns intact   Skin:    General: Skin is warm.  Neurological:     Mental Status: She is alert and oriented to person, place, and time.  Psychiatric:        Mood and Affect: Mood normal.      ED Treatments / Results  Labs (all labs ordered are listed, but only abnormal results are displayed) Labs Reviewed - No data to display  EKG None  Radiology Dg Nasal Bones  Result Date: 03/27/2018 CLINICAL DATA:  Nasal pain and abrasion after falling down 2 steps today. EXAM: NASAL  BONES - 3+ VIEW COMPARISON:  Head CT dated 11/07/2016. FINDINGS: Left maxillary sinus air-fluid level. No fracture seen. IMPRESSION: Left maxillary sinusitis or blood in the sinus.  No fracture seen. Electronically Signed   By: Beckie Salts M.D.   On: 03/27/2018 13:01   Dg Hand Complete Left  Result Date: 03/27/2018 CLINICAL DATA:  Left hand pain after falling down 2 steps today. EXAM: LEFT HAND - COMPLETE 3+ VIEW COMPARISON:  None. FINDINGS: There is no evidence of fracture or dislocation. There is no evidence of arthropathy or other focal bone abnormality. Soft tissues are unremarkable. IMPRESSION: Negative. Electronically Signed   By: Beckie Salts M.D.   On: 03/27/2018 13:00    Procedures Procedures (including critical care time)  Medications Ordered in ED Medications - No data to display   Initial Impression / Assessment and Plan / ED Course  I have reviewed the triage vital signs and the nursing notes.  Pertinent labs & imaging results that were available during my care of the patient were reviewed by me and considered in my medical decision making (see chart for details).     MDM  xrays no fractures.  Pt [laced ion and ace wrap,  Pt advised to return if any problems.   Final Clinical Impressions(s) / ED Diagnoses   Final diagnoses:  Contusion of face, initial encounter  Contusion of left hand, initial encounter    ED Discharge Orders    None    An After Visit Summary was printed and given to the patient.    Elson Areas, PA-C 03/28/18 0930    Blane Ohara, MD 03/29/18 0010

## 2018-04-14 ENCOUNTER — Other Ambulatory Visit: Payer: Self-pay | Admitting: Nurse Practitioner

## 2018-04-14 DIAGNOSIS — N6320 Unspecified lump in the left breast, unspecified quadrant: Secondary | ICD-10-CM

## 2018-04-15 ENCOUNTER — Other Ambulatory Visit: Payer: Self-pay | Admitting: Nurse Practitioner

## 2018-04-15 DIAGNOSIS — N6320 Unspecified lump in the left breast, unspecified quadrant: Secondary | ICD-10-CM

## 2018-04-23 ENCOUNTER — Ambulatory Visit
Admission: RE | Admit: 2018-04-23 | Discharge: 2018-04-23 | Disposition: A | Discharge status: Home / Self Care | Payer: BC Managed Care – PPO | Source: Ambulatory Visit | Attending: Nurse Practitioner | Admitting: Nurse Practitioner

## 2018-04-23 DIAGNOSIS — N6320 Unspecified lump in the left breast, unspecified quadrant: Secondary | ICD-10-CM

## 2018-04-24 ENCOUNTER — Other Ambulatory Visit: Payer: Self-pay | Admitting: Nurse Practitioner

## 2018-04-24 DIAGNOSIS — N6320 Unspecified lump in the left breast, unspecified quadrant: Secondary | ICD-10-CM

## 2018-11-10 ENCOUNTER — Other Ambulatory Visit: Payer: Self-pay | Admitting: Nurse Practitioner

## 2018-11-16 ENCOUNTER — Other Ambulatory Visit: Payer: Self-pay | Admitting: Nurse Practitioner

## 2018-11-16 DIAGNOSIS — N6320 Unspecified lump in the left breast, unspecified quadrant: Secondary | ICD-10-CM

## 2018-12-25 ENCOUNTER — Ambulatory Visit
Admission: RE | Admit: 2018-12-25 | Discharge: 2018-12-25 | Disposition: A | Discharge status: Home / Self Care | Payer: BC Managed Care – PPO | Source: Ambulatory Visit | Attending: Nurse Practitioner | Admitting: Nurse Practitioner

## 2018-12-25 DIAGNOSIS — N6320 Unspecified lump in the left breast, unspecified quadrant: Secondary | ICD-10-CM | POA: Diagnosis present

## 2019-09-23 IMAGING — DX DG CHEST 2V
2 series · 2 of 2 positions shown · non-contrast
Comparison: 11/17/2014 and prior chest radiographs

CLINICAL DATA: Acute chest pain today.

EXAM:
CHEST - 2 VIEW

[chest pa]
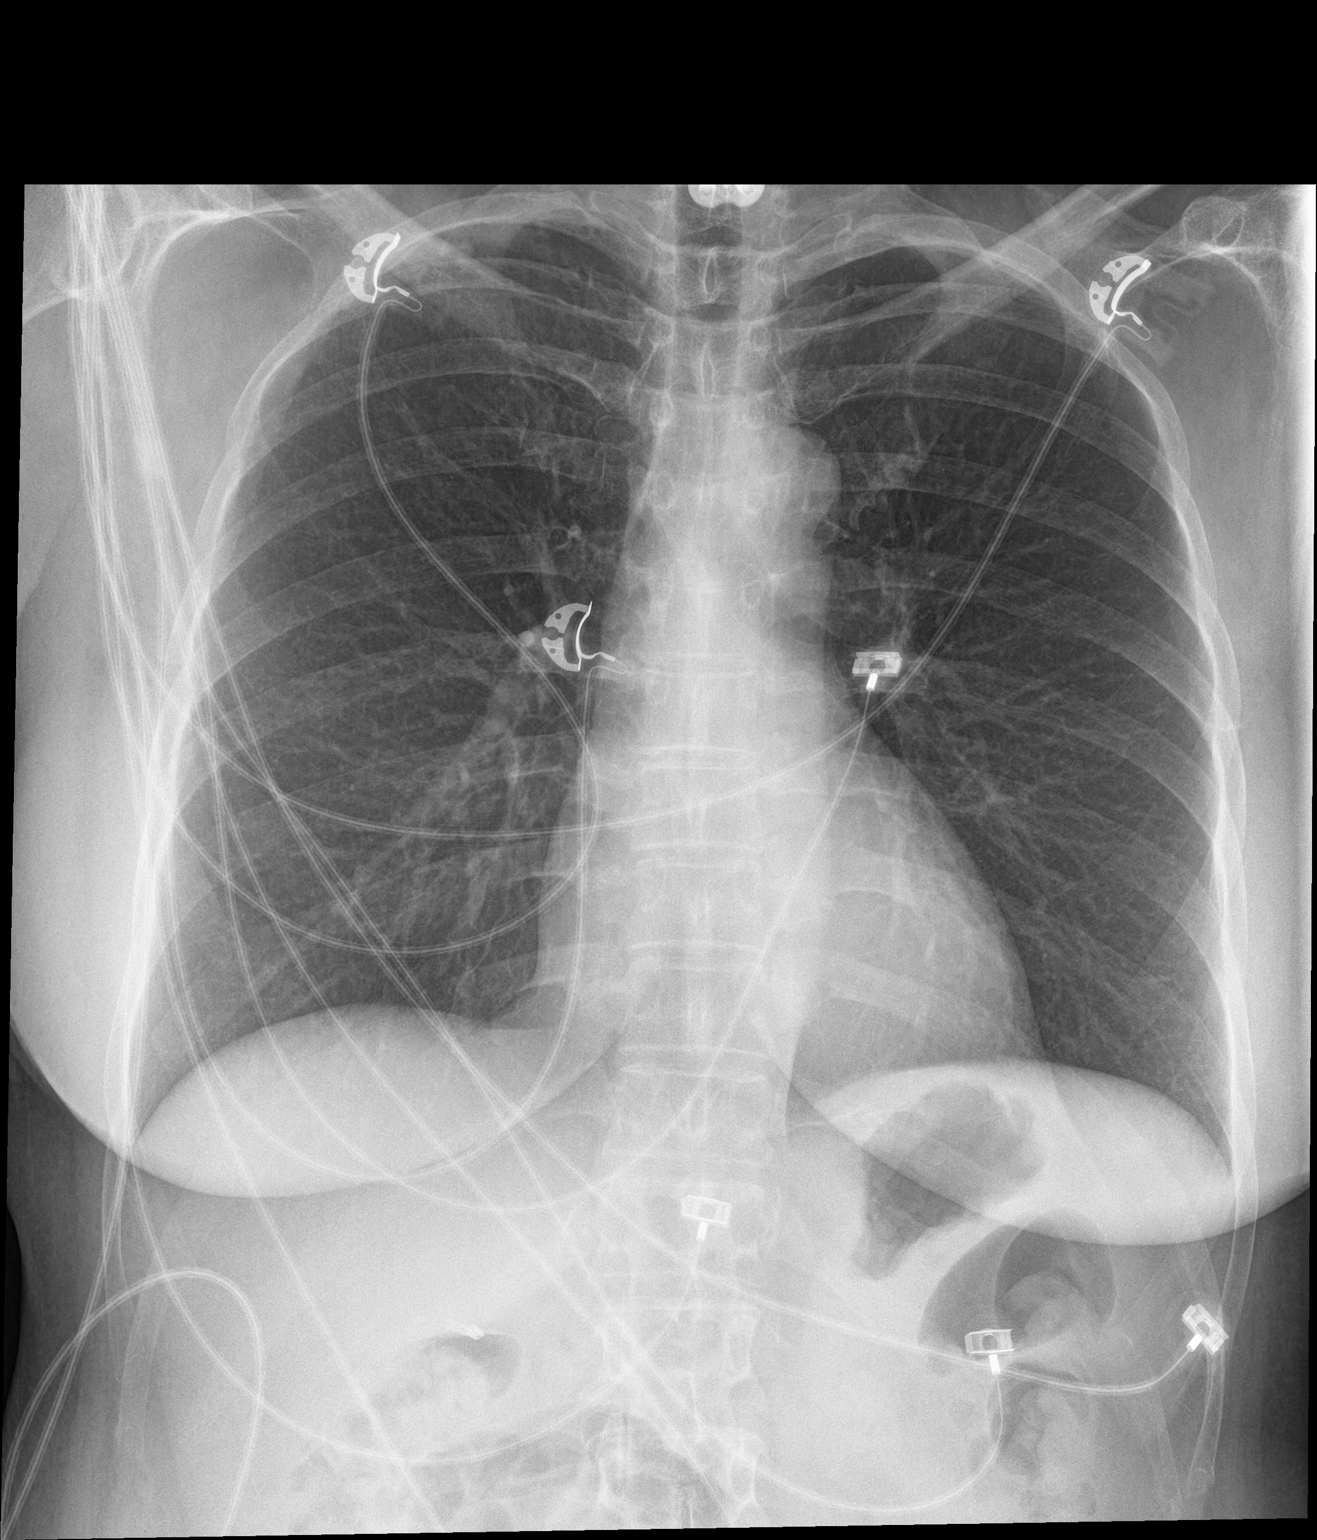

[chest lat]
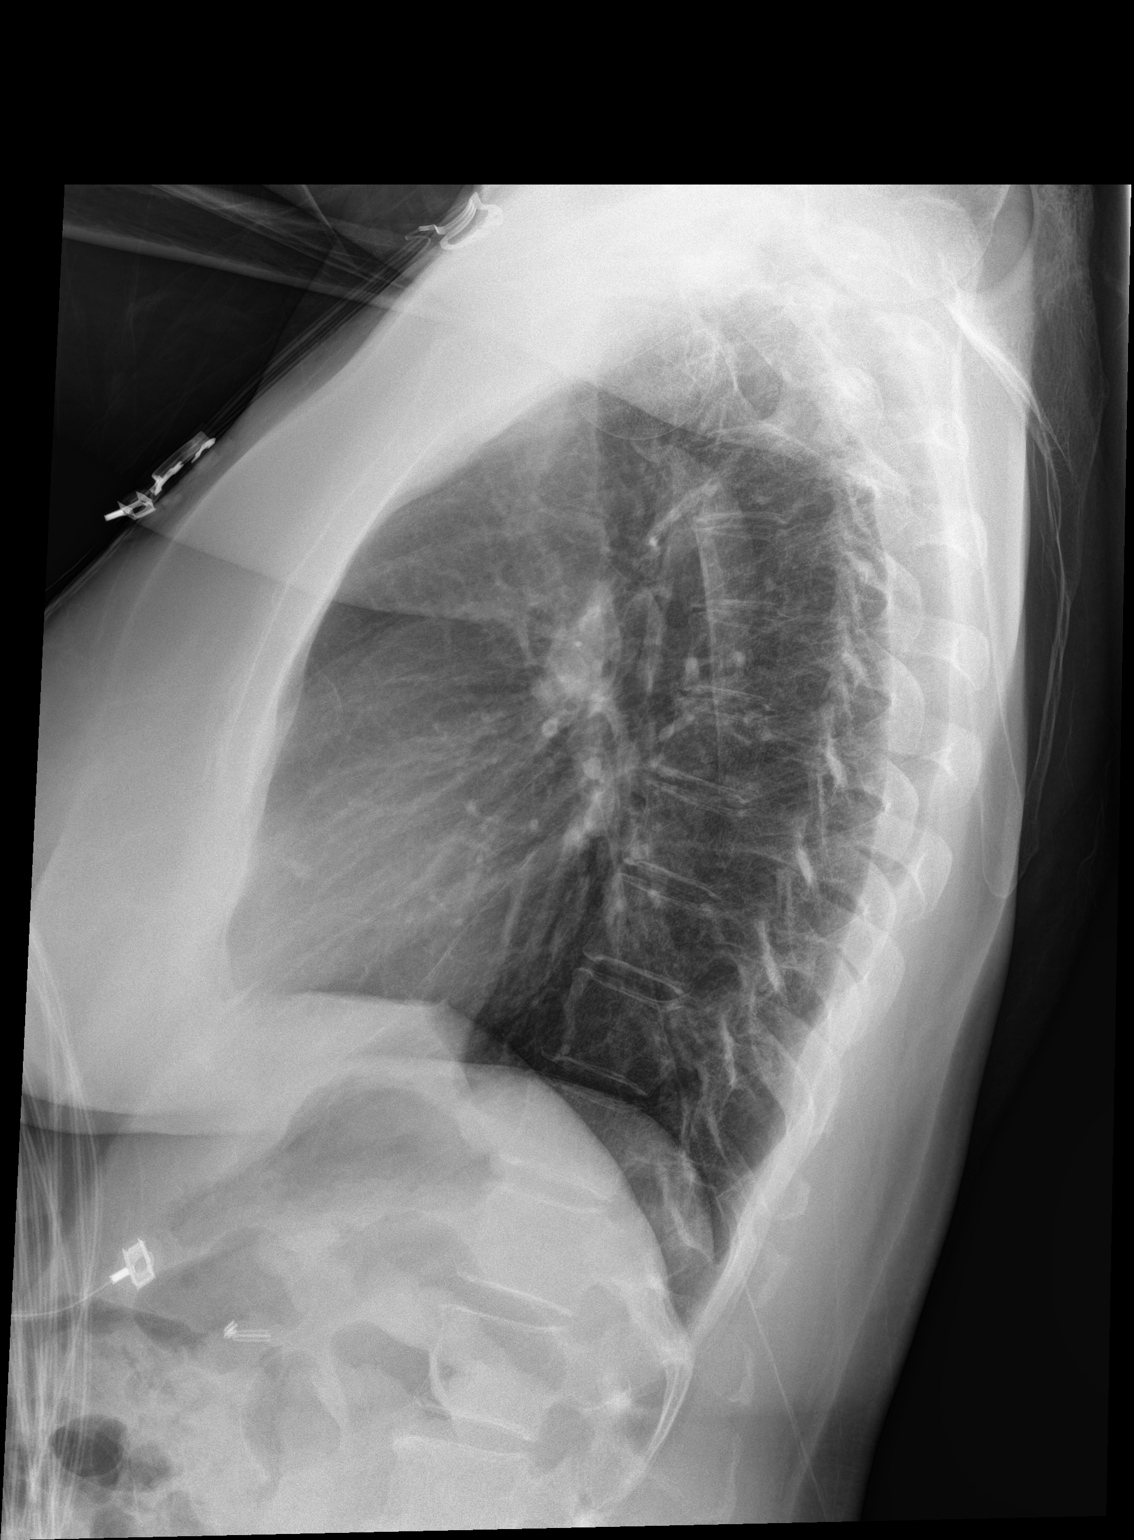

[2 of 2 positions shown; findings below may reference images not displayed]

FINDINGS: The cardiomediastinal silhouette is unremarkable.

There is no evidence of focal airspace disease, pulmonary edema,
suspicious pulmonary nodule/mass, pleural effusion, or pneumothorax.

No acute bony abnormalities are identified.
IMPRESSION: No active cardiopulmonary disease.

## 2019-12-02 ENCOUNTER — Other Ambulatory Visit: Payer: Self-pay | Admitting: Nurse Practitioner

## 2019-12-02 DIAGNOSIS — Z1231 Encounter for screening mammogram for malignant neoplasm of breast: Secondary | ICD-10-CM

## 2020-01-04 ENCOUNTER — Other Ambulatory Visit: Payer: Self-pay

## 2020-01-04 ENCOUNTER — Ambulatory Visit
Admission: RE | Admit: 2020-01-04 | Discharge: 2020-01-04 | Disposition: A | Discharge status: Home / Self Care | Payer: BC Managed Care – PPO | Source: Ambulatory Visit | Attending: Nurse Practitioner | Admitting: Nurse Practitioner

## 2020-01-04 DIAGNOSIS — Z1231 Encounter for screening mammogram for malignant neoplasm of breast: Secondary | ICD-10-CM | POA: Insufficient documentation

## 2021-01-12 ENCOUNTER — Other Ambulatory Visit: Payer: Self-pay | Admitting: Nurse Practitioner

## 2021-01-12 DIAGNOSIS — Z1231 Encounter for screening mammogram for malignant neoplasm of breast: Secondary | ICD-10-CM

## 2021-01-16 ENCOUNTER — Emergency Department (HOSPITAL_COMMUNITY): Payer: BC Managed Care – PPO

## 2021-01-16 ENCOUNTER — Other Ambulatory Visit: Payer: Self-pay

## 2021-01-16 ENCOUNTER — Observation Stay (HOSPITAL_COMMUNITY)
Admission: EM | Admit: 2021-01-16 | Discharge: 2021-01-18 | Disposition: A | Discharge status: Home / Self Care | Payer: BC Managed Care – PPO | Attending: Internal Medicine | Admitting: Internal Medicine

## 2021-01-16 ENCOUNTER — Encounter (HOSPITAL_COMMUNITY): Payer: Self-pay | Admitting: *Deleted

## 2021-01-16 DIAGNOSIS — Z20822 Contact with and (suspected) exposure to covid-19: Secondary | ICD-10-CM | POA: Diagnosis not present

## 2021-01-16 DIAGNOSIS — K219 Gastro-esophageal reflux disease without esophagitis: Secondary | ICD-10-CM | POA: Insufficient documentation

## 2021-01-16 DIAGNOSIS — Z7982 Long term (current) use of aspirin: Secondary | ICD-10-CM | POA: Insufficient documentation

## 2021-01-16 DIAGNOSIS — R42 Dizziness and giddiness: Secondary | ICD-10-CM | POA: Diagnosis present

## 2021-01-16 DIAGNOSIS — Z79899 Other long term (current) drug therapy: Secondary | ICD-10-CM | POA: Insufficient documentation

## 2021-01-16 DIAGNOSIS — I10 Essential (primary) hypertension: Secondary | ICD-10-CM | POA: Diagnosis not present

## 2021-01-16 DIAGNOSIS — I639 Cerebral infarction, unspecified: Secondary | ICD-10-CM | POA: Diagnosis not present

## 2021-01-16 DIAGNOSIS — M542 Cervicalgia: Secondary | ICD-10-CM | POA: Insufficient documentation

## 2021-01-16 LAB — CBC WITH DIFFERENTIAL/PLATELET
Abs Immature Granulocytes: 0.02 10*3/uL (ref 0.00–0.07)
Basophils Absolute: 0 10*3/uL (ref 0.0–0.1)
Basophils Relative: 1 %
Eosinophils Absolute: 0.2 10*3/uL (ref 0.0–0.5)
Eosinophils Relative: 2 %
HCT: 37.9 % (ref 36.0–46.0)
Hemoglobin: 12.5 g/dL (ref 12.0–15.0)
Immature Granulocytes: 0 %
Lymphocytes Relative: 25 %
Lymphs Abs: 2 10*3/uL (ref 0.7–4.0)
MCH: 29.6 pg (ref 26.0–34.0)
MCHC: 33 g/dL (ref 30.0–36.0)
MCV: 89.8 fL (ref 80.0–100.0)
Monocytes Absolute: 0.5 10*3/uL (ref 0.1–1.0)
Monocytes Relative: 6 %
Neutro Abs: 5.2 10*3/uL (ref 1.7–7.7)
Neutrophils Relative %: 66 %
Platelets: 213 10*3/uL (ref 150–400)
RBC: 4.22 MIL/uL (ref 3.87–5.11)
RDW: 13.2 % (ref 11.5–15.5)
WBC: 7.9 10*3/uL (ref 4.0–10.5)
nRBC: 0 % (ref 0.0–0.2)

## 2021-01-16 LAB — BASIC METABOLIC PANEL
Anion gap: 6 (ref 5–15)
BUN: 15 mg/dL (ref 8–23)
CO2: 31 mmol/L (ref 22–32)
Calcium: 9.1 mg/dL (ref 8.9–10.3)
Chloride: 100 mmol/L (ref 98–111)
Creatinine, Ser: 0.83 mg/dL (ref 0.44–1.00)
GFR, Estimated: >60 mL/min (ref >60)
Glucose, Bld: 137 mg/dL — ABNORMAL HIGH (ref 70–99)
Potassium: 2.8 mmol/L — ABNORMAL LOW (ref 3.5–5.1)
Sodium: 137 mmol/L (ref 135–145)

## 2021-01-16 LAB — TROPONIN I (HIGH SENSITIVITY): Troponin I (High Sensitivity): 3 ng/L (ref <18)

## 2021-01-16 MED ORDER — POTASSIUM CHLORIDE CRYS ER 20 MEQ PO TBCR
40.0000 meq | EXTENDED_RELEASE_TABLET | Freq: Once | ORAL | Status: AC
  Administered 2021-01-16: 40 meq via ORAL
  Filled 2021-01-16: qty 2

## 2021-01-16 MED ORDER — IOHEXOL 350 MG/ML SOLN
75.0000 mL | Freq: Once | INTRAVENOUS | Status: AC | PRN
  Administered 2021-01-16: 75 mL via INTRAVENOUS

## 2021-01-16 MED ORDER — POTASSIUM CHLORIDE 10 MEQ/100ML IV SOLN
10.0000 meq | INTRAVENOUS | Status: AC
  Administered 2021-01-17 (×2): 10 meq via INTRAVENOUS
  Filled 2021-01-16 (×2): qty 100

## 2021-01-16 NOTE — ED Notes (Signed)
Patient transported to CT 

## 2021-01-16 NOTE — ED Triage Notes (Addendum)
Denies any dizziness at present.  Hx of surgery on her neck.

## 2021-01-16 NOTE — ED Provider Notes (Signed)
[facility] EMERGENCY DEPARTMENT Provider Note   CSN: [id number] Arrival date & time: 01/16/21  1854     History Chief Complaint  Patient presents with   Dizziness    Leslie Bentley is a 65 y.o. female.  Patient with a history of hypertension presenting with dizzy spells as well as left-sided neck pains.  Patient was having intermittent room spinning dizziness throughout the day today.  Episodes last for several minutes at a time and she has to grab onto her family members for help and has difficulty walking.  Today while coming out of the store she developed some left-sided neck pain that radiated to her left shoulder while she has a dizzy spell.  Dizzy spell lasted for a few minutes and has since resolved with the neck pain persists.  Denies any weakness in her arm.  Denies any headache or visual change.  Denies any chest pain or shortness of breath. She has had dizzy spells over the past few weeks intermittently but not as severe as today.  She denies any chest pain or shortness of breath.  No abdominal pain.  No nausea or vomiting.  No visual changes.  No weakness in her arms or legs.  No difficulty speaking or difficulty swallowing   The history is provided by the patient and a relative.  Dizziness Associated symptoms: no chest pain, no headaches, no nausea and no vomiting       Past Medical History:  Diagnosis Date   Hypertension     Patient Active Problem List   Diagnosis Date Noted   Abnormal laboratory test result 11/17/2014   Acute coronary syndrome ([place]) 11/17/2014    Past Surgical History:  Procedure Laterality Date   BACK SURGERY     CHOLECYSTECTOMY     TONSILLECTOMY       OB History   No obstetric history on file.     Family History  Problem Relation Age of Onset   Breast cancer Neg Hx     Social History   Tobacco Use   Smoking status: Never   Smokeless tobacco: Never  Substance Use Topics   Alcohol use: No   Drug use: No    Home  Medications Prior to Admission medications   Medication Sig Start Date End Date Taking? Authorizing Provider  amLODipine (NORVASC) 5 MG tablet Take 5 mg by mouth every morning.     [provider]  aspirin 81 MG chewable tablet Chew 81 mg by mouth at bedtime.     [provider]  cetirizine (ZYRTEC) 10 MG tablet Take 10 mg by mouth at bedtime.     [provider]  cholecalciferol (VITAMIN D) 1000 UNITS tablet Take 1,000 Units by mouth daily.    [provider]  hydrochlorothiazide (HYDRODIURIL) 25 MG tablet Take 25 mg by mouth every morning.     [provider]  metoprolol succinate (TOPROL-XL) 100 MG 24 hr tablet Take 100 mg by mouth at bedtime.     [provider]  pantoprazole (PROTONIX) 40 MG tablet Take 40 mg by mouth every morning.     [provider]  potassium chloride SA (K-DUR,KLOR-CON) 20 MEQ tablet Take 1 tablet (20 mEq total) by mouth daily. 12/19/17   Julianne Rice, MD  Propylene Glycol (SYSTANE BALANCE) 0.6 % SOLN Place 1-2 drops into the left eye daily as needed (for irritation).    [provider]    Allergies    Patient has no known allergies.  Review of Systems   Review of Systems  Constitutional:  Negative for activity change, appetite change, fatigue and fever.  HENT:  Negative for congestion and rhinorrhea.   Eyes:  Negative for visual disturbance.  Respiratory:  Negative for cough.   Cardiovascular:  Negative for chest pain.  Gastrointestinal:  Negative for abdominal pain, nausea and vomiting.  Genitourinary:  Negative for dysuria and hematuria.  Musculoskeletal:  Positive for neck pain. Negative for arthralgias, back pain and myalgias.  Skin:  Negative for rash and wound.  Neurological:  Positive for dizziness and numbness. Negative for headaches.   all other systems are negative except as noted in the HPI and PMH.   Physical Exam Updated Vital Signs BP 134/73   Pulse 68   Temp 98.5  F (36.9 C) (Oral)   Resp (!) 22   Ht 5\' 2"  (1.575 m)   Wt 68 kg   SpO2 100%   BMI 27.44 kg/m   Physical Exam Vitals and nursing note reviewed.  Constitutional:      General: She is not in acute distress.    Appearance: She is well-developed.  HENT:     Head: Normocephalic and atraumatic.     Mouth/Throat:     Pharynx: No oropharyngeal exudate.  Eyes:     Conjunctiva/sclera: Conjunctivae normal.     Pupils: Pupils are equal, round, and reactive to light.  Neck:     Comments: Paraspinal cervical tenderness no midline tenderness Cardiovascular:     Rate and Rhythm: Normal rate and regular rhythm.     Heart sounds: Normal heart sounds. No murmur heard. Pulmonary:     Effort: Pulmonary effort is normal. No respiratory distress.     Breath sounds: Normal breath sounds.  Chest:     Chest wall: No tenderness.  Abdominal:     Palpations: Abdomen is soft.     Tenderness: There is no abdominal tenderness. There is no guarding or rebound.  Musculoskeletal:        General: No tenderness. Normal range of motion.     Cervical back: Normal range of motion.  Skin:    General: Skin is warm.  Neurological:     Mental Status: She is alert and oriented to person, place, and time.     Cranial Nerves: No cranial nerve deficit.     Motor: No abnormal muscle tone.     Coordination: Coordination normal.     Comments: CN 2-12 intact, no ataxia on finger to nose, no nystagmus, 5/5 strength throughout, no pronator drift, Romberg negative, normal gait. No appreciable nystagmus.  Head impulse testing negative, test of skew negative  Psychiatric:        Behavior: Behavior normal.    ED Results / Procedures / Treatments   Labs (all labs ordered are listed, but only abnormal results are displayed) Labs Reviewed  BASIC METABOLIC PANEL - Abnormal; Notable for the following components:      Result Value   Potassium 2.8 (*)    Glucose, Bld 137 (*)    All other components within normal limits   RESP PANEL BY RT-PCR (FLU A&B, COVID) ARPGX2  CBC WITH DIFFERENTIAL/PLATELET  MAGNESIUM  TROPONIN I (HIGH SENSITIVITY)  TROPONIN I (HIGH SENSITIVITY)    EKG EKG Interpretation  Date/Time:  Tuesday January 16 2021 22:30:23 EST Ventricular Rate:  71 PR Interval:  197 QRS Duration: 88 QT Interval:  376 QTC Calculation: 409 R Axis:   65 Text Interpretation: Sinus rhythm Since last tracing rate  slower Confirmed by Dorie Rank [phone]) on 01/16/2021 10:38:24 PM  Radiology CT ANGIO HEAD NECK W WO CM  Result Date: 01/17/2021 CLINICAL DATA:  Dizziness, hand weakness, neck pain EXAM: CT ANGIOGRAPHY HEAD AND NECK TECHNIQUE: Multidetector CT imaging of the head and neck was performed using the standard protocol during bolus administration of intravenous contrast. Multiplanar CT image reconstructions and MIPs were obtained to evaluate the vascular anatomy. Carotid stenosis measurements (when applicable) are obtained utilizing NASCET criteria, using the distal internal carotid diameter as the denominator. CONTRAST:  50mL OMNIPAQUE IOHEXOL 350 MG/ML SOLN COMPARISON:  No prior CTA, correlation is made with 11/07/2016 CT head FINDINGS: CT HEAD FINDINGS Brain: No acute infarct, hemorrhage, mass, mass effect, or midline shift. No hydrocephalus or extra-axial collection. Vascular: No hyperdense vessel. Skull: No acute osseous abnormality. Sinuses: Imaged portions are clear. Orbits: No acute finding. Review of the MIP images confirms the above findings CTA NECK FINDINGS Aortic arch: Bovine imaged portion shows no evidence of aneurysm or dissection. No significant stenosis of the major arch vessel origins. Right carotid system: No evidence of dissection, stenosis (50% or greater) or occlusion. Left carotid system: No evidence of dissection, stenosis (50% or greater) or occlusion. Vertebral arteries: Codominant. No evidence of dissection, stenosis (50% or greater) or occlusion. Skeleton: Status post ACDF C6-C7.  Degenerative changes in the cervical spine. No acute osseous abnormality. Other neck: Negative. Upper chest: Right greater than left apical pleuroparenchymal scarring. No focal pulmonary opacity. Review of the MIP images confirms the above findings CTA HEAD FINDINGS Anterior circulation: Both internal carotid arteries are patent to the termini, without stenosis or other abnormality. Hypoplastic right A1. A1 segments patent. Normal anterior communicating artery. Anterior cerebral arteries are patent to their distal aspects. No M1 stenosis or occlusion. Normal MCA bifurcations. Distal MCA branches perfused and symmetric. Posterior circulation: Vertebral arteries patent to the vertebrobasilar junction without stenosis. Basilar patent to its distal aspect. Superior cerebellar arteries patent bilaterally. PCAs perfused to their distal aspects without stenosis. Bilateral posterior communicating arteries are not visualized. Venous sinuses: As permitted by contrast timing, patent. Anatomic variants: None significant Review of the MIP images confirms the above findings IMPRESSION: 1. No acute intracranial process. 2. No intracranial large vessel occlusion or significant stenosis. 3. No hemodynamically significant stenosis in the neck. Electronically Signed   By: Merilyn Baba M.D.   On: 01/17/2021 01:32    Procedures Procedures   Medications Ordered in ED Medications  potassium chloride 10 mEq in 100 mL IVPB (has no administration in time range)  potassium chloride SA (KLOR-CON) CR tablet 40 mEq (has no administration in time range)  iohexol (OMNIPAQUE) 350 MG/ML injection 75 mL (has no administration in time range)    ED Course  I have reviewed the triage vital signs and the nursing notes.  Pertinent labs & imaging results that were available during my care of the patient were reviewed by me and considered in my medical decision making (see chart for details).    MDM Rules/Calculators/A&P                           Vertiginous spells with left-sided neck pain, no current vertigo.  Neuro exam is nonfocal.  EKG shows no acute ischemia.  Imaging will be obtained to evaluate neck vasculature.  CTA shows no large vessel occlusion with no stenosis.  No signs of stroke.  Patient still with intermittent dizziness.  Her neck pain has resolved.  Some  ataxia and walking.  She states her neck pain is improved.  There is some underlying concern for possible stroke given her persistent dizzy spells as well as intermittent neck pain. She is agreeable to overnight observation for stroke rule out.  Discussed with Dr. Josephine Cables.  Final Clinical Impression(s) / ED Diagnoses Final diagnoses:  Vertigo    Rx / DC Orders ED Discharge Orders     None        Jatavious Peppard, Annie Main, MD 01/17/21 [phone]

## 2021-01-16 NOTE — ED Triage Notes (Signed)
Pt left a store earlier today and had some dizziness.  States she had some weakness in bilateral hands.  Pt had some neck pain that radiates down to shoulder blade.  Denies any SOB or diaphoresis.

## 2021-01-17 ENCOUNTER — Observation Stay (HOSPITAL_COMMUNITY): Payer: BC Managed Care – PPO

## 2021-01-17 ENCOUNTER — Other Ambulatory Visit: Payer: Self-pay

## 2021-01-17 ENCOUNTER — Encounter (HOSPITAL_COMMUNITY): Payer: Self-pay | Admitting: Internal Medicine

## 2021-01-17 ENCOUNTER — Observation Stay (HOSPITAL_BASED_OUTPATIENT_CLINIC_OR_DEPARTMENT_OTHER): Payer: BC Managed Care – PPO

## 2021-01-17 DIAGNOSIS — M542 Cervicalgia: Secondary | ICD-10-CM | POA: Diagnosis not present

## 2021-01-17 DIAGNOSIS — I10 Essential (primary) hypertension: Secondary | ICD-10-CM | POA: Diagnosis not present

## 2021-01-17 DIAGNOSIS — R42 Dizziness and giddiness: Secondary | ICD-10-CM

## 2021-01-17 DIAGNOSIS — I6389 Other cerebral infarction: Secondary | ICD-10-CM

## 2021-01-17 DIAGNOSIS — K219 Gastro-esophageal reflux disease without esophagitis: Secondary | ICD-10-CM

## 2021-01-17 DIAGNOSIS — I639 Cerebral infarction, unspecified: Secondary | ICD-10-CM

## 2021-01-17 LAB — ECHOCARDIOGRAM COMPLETE
AR max vel: 2.28 cm2
AV Area VTI: 2.43 cm2
AV Area mean vel: 2.42 cm2
AV Mean grad: 4 mmHg
AV Peak grad: 7.5 mmHg
Ao pk vel: 1.37 m/s
Area-P 1/2: 2.62 cm2
Calc EF: 60.6 %
Height: 62 in
MV VTI: 2.71 cm2
S' Lateral: 2.9 cm
Single Plane A2C EF: 61 %
Single Plane A4C EF: 59 %
Weight: 2342.17 oz

## 2021-01-17 LAB — LIPID PANEL
Cholesterol: 119 mg/dL (ref 0–200)
HDL: 50 mg/dL (ref >40)
LDL Cholesterol: 57 mg/dL (ref 0–99)
Total CHOL/HDL Ratio: 2.4 RATIO
Triglycerides: 61 mg/dL (ref <150)
VLDL: 12 mg/dL (ref 0–40)

## 2021-01-17 LAB — MAGNESIUM: Magnesium: 2.1 mg/dL (ref 1.7–2.4)

## 2021-01-17 LAB — HEMOGLOBIN A1C
Hgb A1c MFr Bld: 5.5 % (ref 4.8–5.6)
Mean Plasma Glucose: 111.15 mg/dL

## 2021-01-17 LAB — RESP PANEL BY RT-PCR (FLU A&B, COVID) ARPGX2
Influenza A by PCR: NEGATIVE
Influenza B by PCR: NEGATIVE
SARS Coronavirus 2 by RT PCR: NEGATIVE

## 2021-01-17 LAB — TROPONIN I (HIGH SENSITIVITY): Troponin I (High Sensitivity): 5 ng/L (ref <18)

## 2021-01-17 LAB — HIV ANTIBODY (ROUTINE TESTING W REFLEX): HIV Screen 4th Generation wRfx: NONREACTIVE

## 2021-01-17 MED ORDER — ASPIRIN 81 MG PO CHEW
81.0000 mg | CHEWABLE_TABLET | Freq: Every day | ORAL | Status: DC
  Administered 2021-01-17 – 2021-01-18 (×2): 81 mg via ORAL
  Filled 2021-01-17 (×2): qty 1

## 2021-01-17 MED ORDER — SIMVASTATIN 20 MG PO TABS
20.0000 mg | ORAL_TABLET | Freq: Every day | ORAL | Status: DC
  Administered 2021-01-17: 20 mg via ORAL
  Filled 2021-01-17: qty 1

## 2021-01-17 MED ORDER — MECLIZINE HCL 12.5 MG PO TABS
25.0000 mg | ORAL_TABLET | Freq: Two times a day (BID) | ORAL | Status: DC | PRN
  Filled 2021-01-17: qty 2

## 2021-01-17 MED ORDER — MECLIZINE HCL 12.5 MG PO TABS: 25.0000 mg | ORAL_TABLET | Freq: Once | ORAL | Status: DC

## 2021-01-17 MED ORDER — PANTOPRAZOLE SODIUM 40 MG PO TBEC
40.0000 mg | DELAYED_RELEASE_TABLET | Freq: Every day | ORAL | Status: DC
  Administered 2021-01-17 – 2021-01-18 (×2): 40 mg via ORAL
  Filled 2021-01-17 (×2): qty 1

## 2021-01-17 MED ORDER — ACETAMINOPHEN 325 MG PO TABS
650.0000 mg | ORAL_TABLET | Freq: Four times a day (QID) | ORAL | Status: DC | PRN
  Administered 2021-01-17: 650 mg via ORAL

## 2021-01-17 NOTE — Progress Notes (Signed)
OT Cancellation Note  Patient Details Name: ARMINE RIZZOLO MRN: [id number] DOB: March 27, 1955   Cancelled Treatment:    Reason Eval/Treat Not Completed: OT screened, no needs identified, will sign off. Patient functioning at baseline for ADL tasks and functional transfers. PT evaluation was completed which addressed patient's balance and dizziness. Thank you for the referral.   Limmie Patricia, OTR/L,CBIS  [phone]  01/17/2021, 3:54 PM

## 2021-01-17 NOTE — Progress Notes (Signed)
*  PRELIMINARY RESULTS* Echocardiogram 2D Echocardiogram has been performed.  Carolyne Fiscal 01/17/2021, 11:52 AM

## 2021-01-17 NOTE — H&P (Signed)
History and Physical  Leslie ShellingCharlene L Bentley ZOX:[id number] DOB: 03/07/1955 DOA: 01/16/2021  Referring physician: Glynn Bentley, Stephen, MD PCP: Leslie Bentley, Leslie F, NP  Patient coming from: Home  Chief Complaint: Dizziness  HPI: Leslie Bentley is a 65 y.o. female with medical history significant for hypertension, GERD who presents to the emergency department due to dizziness and left-sided neck pain.  Patient states that while walking out of [employer] yesterday with daughter, she felt a left-sided neck pain which was associated with left-sided weakness.  Patient states that she has to lean on daughter till she gets to the car.  Daughter was concerned that symptoms may be due to a stroke, so she was brought to the ED for further evaluation and management.  Patient denies chest pain, shortness of breath nausea, vomiting, abdominal pain or any extremity weakness, numbness or tingling. Apparently, patient has had some intermittent dizziness within past few weeks, but yesterday's dizziness was considered worse.  ED Course:  In the emergency department, she was hemodynamically stable.  Work-up in the ED showed normal CBC, normal BMP except hypokalemia, troponin x2 was negative, magnesium was 2.1.  Influenza a, B, SARS coronavirus 2 was negative. Sit angiography of head and neck showed no acute intracranial process.  No intracranial large vessel occlusion or significant stenosis.  No hemodynamically significant stenosis in the neck Potassium was replenished, meclizine was provided.  Review of Systems: Constitutional: Negative for chills and fever.  HENT: Negative for ear pain and sore throat.   Eyes: Negative for pain and visual disturbance.  Respiratory: Negative for cough, chest tightness and shortness of breath.   Cardiovascular: Negative for chest pain and palpitations.  Gastrointestinal: Negative for abdominal pain and vomiting.  Endocrine: Negative for polyphagia and polyuria.  Genitourinary:  Negative for decreased urine volume, dysuria, enuresis Musculoskeletal: Positive for neck pain.  Negative for arthralgias and back pain.  Lymphatics: No axillary or supraclavicular lymphadenopathy Skin: Negative for color change and rash.  Allergic/Immunologic: Negative for immunocompromised state.  Neurological: Positive for transitory left arm weakness, dizziness.  Negative for tremors, syncope, speech difficulty  Hematological: Does not bruise/bleed easily.  All other systems reviewed and are negative  Past Medical History:  Diagnosis Date   Hypertension    Past Surgical History:  Procedure Laterality Date   BACK SURGERY     CHOLECYSTECTOMY     TONSILLECTOMY      Social History:  reports that she has never smoked. She has never used smokeless tobacco. She reports that she does not drink alcohol and does not use drugs.   No Known Allergies  Family History  Problem Relation Age of Onset   Breast cancer Neg Hx       Prior to Admission medications   Medication Sig Start Date End Date Taking? Authorizing Provider  amLODipine (NORVASC) 5 MG tablet Take 5 mg by mouth every morning.     [provider]  aspirin 81 MG chewable tablet Chew 81 mg by mouth at bedtime.     [provider]  cetirizine (ZYRTEC) 10 MG tablet Take 10 mg by mouth at bedtime.     [provider]  cholecalciferol (VITAMIN D) 1000 UNITS tablet Take 1,000 Units by mouth daily.    [provider]  hydrochlorothiazide (HYDRODIURIL) 25 MG tablet Take 25 mg by mouth every morning.     [provider]  metoprolol succinate (TOPROL-XL) 100 MG 24 hr tablet Take 100 mg by mouth at bedtime.     [provider]  pantoprazole (PROTONIX) 40 MG tablet Take 40 mg by mouth every morning.     [provider]  potassium chloride SA (K-DUR,KLOR-CON) 20 MEQ tablet Take 1 tablet (20 mEq total) by mouth daily. 12/19/17   Loren Racer, MD  Propylene Glycol (SYSTANE  BALANCE) 0.6 % SOLN Place 1-2 drops into the left eye daily as needed (for irritation).    [provider]    Physical Exam: BP (!) 124/59   Pulse 73   Temp 98.5 Bentley (36.9 C) (Oral)   Resp 14   Ht 5\' 2"  (1.575 m)   Wt 68 kg   SpO2 99%   BMI 27.44 kg/m   General: 65 y.o. year-old female well developed well nourished in no acute distress.  Alert and oriented x3. HEENT: NCAT, EOMI Neck: Supple, trachea medial Cardiovascular: Regular rate and rhythm with no rubs or gallops.  No thyromegaly or JVD noted.  No lower extremity edema. 2/4 pulses in all 4 extremities. Respiratory: Clear to auscultation with no wheezes or rales. Good inspiratory effort. Abdomen: Soft, nontender nondistended with normal bowel sounds x4 quadrants. Muskuloskeletal: Tender to palpation of left-side of neck.  No cyanosis, clubbing or edema noted bilaterally Neuro: CN II-XII intact, strength 5/5 x 4, sensation, reflexes intact Skin: No ulcerative lesions noted or rashes Psychiatry: Judgement and insight appear normal. Mood is appropriate for condition and setting          Labs on Admission:  Basic Metabolic Panel: Recent Labs  Lab 01/16/21 1953 01/17/21 0117  NA 137  --   K 2.8*  --   CL 100  --   CO2 31  --   GLUCOSE 137*  --   BUN 15  --   CREATININE 0.83  --   CALCIUM 9.1  --   MG  --  2.1   Liver Function Tests: No results for input(s): AST, ALT, ALKPHOS, BILITOT, PROT, ALBUMIN in the last 168 hours. No results for input(s): LIPASE, AMYLASE in the last 168 hours. No results for input(s): AMMONIA in the last 168 hours. CBC: Recent Labs  Lab 01/16/21 1953  WBC 7.9  NEUTROABS 5.2  HGB 12.5  HCT 37.9  MCV 89.8  PLT 213   Cardiac Enzymes: No results for input(s): CKTOTAL, CKMB, CKMBINDEX, TROPONINI in the last 168 hours.  BNP (last 3 results) No results for input(s): BNP in the last 8760 hours.  ProBNP (last 3 results) No results for input(s): PROBNP in the last 8760  hours.  CBG: No results for input(s): GLUCAP in the last 168 hours.  Radiological Exams on Admission: CT ANGIO HEAD NECK W WO CM  Result Date: 01/17/2021 CLINICAL DATA:  Dizziness, hand weakness, neck pain EXAM: CT ANGIOGRAPHY HEAD AND NECK TECHNIQUE: Multidetector CT imaging of the head and neck was performed using the standard protocol during bolus administration of intravenous contrast. Multiplanar CT image reconstructions and MIPs were obtained to evaluate the vascular anatomy. Carotid stenosis measurements (when applicable) are obtained utilizing NASCET criteria, using the distal internal carotid diameter as the denominator. CONTRAST:  63mL OMNIPAQUE IOHEXOL 350 MG/ML SOLN COMPARISON:  No prior CTA, correlation is made with 11/07/2016 CT head FINDINGS: CT HEAD FINDINGS Brain: No acute infarct, hemorrhage, mass, mass effect, or midline shift. No hydrocephalus or extra-axial collection. Vascular: No hyperdense vessel. Skull: No acute osseous abnormality. Sinuses: Imaged portions are clear. Orbits: No acute finding. Review of the MIP images confirms the above findings CTA NECK FINDINGS Aortic arch: Bovine imaged  portion shows no evidence of aneurysm or dissection. No significant stenosis of the major arch vessel origins. Right carotid system: No evidence of dissection, stenosis (50% or greater) or occlusion. Left carotid system: No evidence of dissection, stenosis (50% or greater) or occlusion. Vertebral arteries: Codominant. No evidence of dissection, stenosis (50% or greater) or occlusion. Skeleton: Status post ACDF C6-C7. Degenerative changes in the cervical spine. No acute osseous abnormality. Other neck: Negative. Upper chest: Right greater than left apical pleuroparenchymal scarring. No focal pulmonary opacity. Review of the MIP images confirms the above findings CTA HEAD FINDINGS Anterior circulation: Both internal carotid arteries are patent to the termini, without stenosis or other abnormality.  Hypoplastic right A1. A1 segments patent. Normal anterior communicating artery. Anterior cerebral arteries are patent to their distal aspects. No M1 stenosis or occlusion. Normal MCA bifurcations. Distal MCA branches perfused and symmetric. Posterior circulation: Vertebral arteries patent to the vertebrobasilar junction without stenosis. Basilar patent to its distal aspect. Superior cerebellar arteries patent bilaterally. PCAs perfused to their distal aspects without stenosis. Bilateral posterior communicating arteries are not visualized. Venous sinuses: As permitted by contrast timing, patent. Anatomic variants: None significant Review of the MIP images confirms the above findings IMPRESSION: 1. No acute intracranial process. 2. No intracranial large vessel occlusion or significant stenosis. 3. No hemodynamically significant stenosis in the neck. Electronically Signed   By: Merilyn Baba M.D.   On: 01/17/2021 01:32    EKG: I independently viewed the EKG done and my findings are as followed: Normal sinus rhythm at a rate of 71 bpm  Assessment/Plan Present on Admission:  Acute ischemic stroke [facility])  Principal Problem:   Acute ischemic stroke [facility]) Active Problems:   Neck pain on left side   Dizziness   Essential hypertension   GERD (gastroesophageal reflux disease)   Left neck pain with accompanying left-sided weakness, rule out acute ischemic stroke Patient's transitory left arm weakness may be due to radiculopathy from associated tender left paraspinal muscles CT angio of head and neck was negative for any acute abnormality Patient will be admitted to telemetry unit Patient cannot have MRI due to having metal hardware from prior surgery, CT of head without contrast will be repeated 24 hours from last CT. Echocardiogram in the morning Continue fall precautions and neuro checks Lipid panel and hemoglobin A1c will be checked Continue PT/OT eval and treat Bedside swallow eval by nursing prior  to diet Consider tele neurology consult status post imaging studies  Intermittent dizziness rule out BPPV Continue meclizine Continue PT/OT eval and treat  Essential hypertension  Antihypertensives PRN if Blood pressure is greater than 220/120 or there is a concern for End organ damage/contraindications for permissive HTN. If blood pressure is greater than 220/120 give labetalol PO or IV or Vasotec IV with a goal of 15% reduction in BP during the first 24 hours.  GERD Continue Protonix  DVT prophylaxis: SCDs  Code Status: Full code  Family Communication: None at bedside  Disposition Plan:  Patient is from:                        home Anticipated DC to:                   SNF or family members home Anticipated DC date:               1-2 days Anticipated DC barriers:          Patient requires inpatient  management due to pending complete of acute ischemic stroke work-up   Consults called: None  Admission status: Observation    Bernadette Hoit MD [facility]  01/17/2021, 6:54 AM

## 2021-01-17 NOTE — ED Notes (Signed)
Pt A&OX4 ambulatory to and from restroom with independent steady gait. She reports she is no dizzy and states she did not want the Antivert because she was not dizzy.

## 2021-01-17 NOTE — Progress Notes (Signed)
Leslie Bentley is a 65 y.o. female with medical history significant for hypertension, GERD who presents to the emergency department due to dizziness and left-sided neck pain.  CT head was negative for any acute findings and she cannot receive MRI due to hardware in her body.  Plan will be to repeat head CT in 24 hours to ensure that there is no finding of CVA.  She is to continue on her aspirin and statin. TeleNeurology evaluation completed with findings of dizziness that may be significant for BPPV.  PT consulted for vestibular rehabilitation/Epley's maneuver.  Anticipate discharge in a.m. pending repeat CT with follow-up to ENT.  Total care time: 25 minutes.

## 2021-01-17 NOTE — Consult Note (Signed)
I connected with  Leslie Bentley on 01/17/21 by a video enabled telemedicine application and verified that I am speaking with the correct person using two identifiers.   I discussed the limitations of evaluation and management by telemedicine. The patient expressed understanding and agreed to proceed.  Location of patient: [facility] Location of physician: [facility]  Neurology Consultation Reason for Consult: dizziness Referring Physician: Dr Maurilio Lovely  CC: Dizziness  History is obtained from: Patient, chart review  HPI: Leslie Bentley is a 65 y.o. female with past medical history of hypertension, hyperlipidemia who presented to emergency room yesterday with complaints of dizziness, speech disturbance and left-sided neck pain.  Patient states she was walking out of [employer] yesterday when suddenly she felt dizzy described as the floor is lifted up and she has to step higher.  She came and sat in the car.  Her daughter was trying to talk to her, patient was able to understand her but did not want to respond because she was feeling dizzy.  She also had some left-sided neck pain as well as weakness in both upper extremities. All the symptoms lasted for few minutes and then completely resolved.  Patient states she has had similar episodes of dizziness multiple times in the past, most recently twice in the last week.  Denies any specific triggers like changes in head position, cold, cough, trauma.  Denies any vision changes, slurred speech, weakness of arms or legs, paresthesias.  Denies any new medication changes.  However does state that she has been extremely stressed.  Also reports tinnitus and hearing echo in her right ear.  Blood pressure on arrival 154/64   ROS: All other systems reviewed and negative except as noted in the HPI.  Past Medical History:  Diagnosis Date   Hypertension     Family History  Problem Relation Age of Onset   Breast cancer Neg Hx      Social History:  reports that she has never smoked. She has never used smokeless tobacco. She reports that she does not drink alcohol and does not use drugs.   Medications Prior to Admission  Medication Sig Dispense Refill Last Dose   amLODipine (NORVASC) 5 MG tablet Take 5 mg by mouth every morning.    01/16/2021   aspirin 81 MG chewable tablet Chew 81 mg by mouth at bedtime.    01/15/2021   cetirizine (ZYRTEC) 10 MG tablet Take 10 mg by mouth at bedtime.    01/15/2021   cholecalciferol (VITAMIN D) 1000 UNITS tablet Take 1,000 Units by mouth daily.   01/16/2021   hydrochlorothiazide (HYDRODIURIL) 25 MG tablet Take 25 mg by mouth every morning.    01/16/2021   metoprolol succinate (TOPROL-XL) 100 MG 24 hr tablet Take 100 mg by mouth at bedtime.    01/15/2021 at 2000   pantoprazole (PROTONIX) 40 MG tablet Take 40 mg by mouth every morning.    01/16/2021   potassium chloride SA (K-DUR,KLOR-CON) 20 MEQ tablet Take 1 tablet (20 mEq total) by mouth daily. 3 tablet 0 01/15/2021   Propylene Glycol 0.6 % SOLN Place 1-2 drops into the left eye daily as needed (for irritation).   PRN   simvastatin (ZOCOR) 20 MG tablet Take 20 mg by mouth at bedtime.   01/15/2021      Exam: Current vital signs: BP (!) 116/54 (BP Location: Left Arm)   Pulse 71   Temp 98.2 F (36.8 C) (Oral)   Resp 20  Ht 5\' 2"  (1.575 m)   Wt 66.4 kg   SpO2 99%   BMI 26.77 kg/m  Vital signs in last 24 hours: Temp:  [97.9 F (36.6 C)-98.5 F (36.9 C)] 98.2 F (36.8 C) (11/23 1054) Pulse Rate:  [67-101] 71 (11/23 1054) Resp:  [14-22] 20 (11/23 1054) BP: (106-154)/(49-73) 116/54 (11/23 1054) SpO2:  [80 %-100 %] 99 % (11/23 1054) Weight:  [66.4 kg-68 kg] 66.4 kg (11/23 0832)   Physical Exam  Constitutional: Appears well-developed and well-nourished.  Psych: Affect appropriate to situation Eyes: No scleral injection Neuro: AOx3, no evidence of aphasia or dysarthria, PERRLA, EOMI, no evidence of visual neglect, no  facial asymmetry, facial sensation intact to light touch, antigravity strength in all 4 extremities without drift, FTN intact bilaterally  I have reviewed labs in epic and the results pertinent to this consultation are: CBC:  Recent Labs  Lab 01/16/21 1953  WBC 7.9  NEUTROABS 5.2  HGB 12.5  HCT 37.9  MCV 89.8  PLT 213    Basic Metabolic Panel:  Lab Results  Component Value Date   NA 137 01/16/2021   K 2.8 (L) 01/16/2021   CO2 31 01/16/2021   GLUCOSE 137 (H) 01/16/2021   BUN 15 01/16/2021   CREATININE 0.83 01/16/2021   CALCIUM 9.1 01/16/2021   GFRNONAA >60 01/16/2021   GFRAA >60 12/18/2017   Lipid Panel:  Lab Results  Component Value Date   LDLCALC 57 01/17/2021   HgbA1c: No results found for: HGBA1C Urine Drug Screen: No results found for: LABOPIA, COCAINSCRNUR, LABBENZ, AMPHETMU, THCU, LABBARB  Alcohol Level No results found for: ETH   I have reviewed the images obtained:  CT head without contrast 01/16/2021: No acute abnormality  CTA head and neck 01/16/2021: No intracranial large vessel occlusion or significant stenosis. No hemodynamically significant stenosis in the neck.  ASSESSMENT/PLAN: 65 year old female who presented with transient dizziness, left-sided neck pain and bilateral upper extremity weakness which lasted for a few minutes and has since resolved.  Transient vertigo -Patient did not have any other focal neurological deficits other than dizziness.  She has had multiple similar episodes in the past.  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 Recommend continuing for stroke prevention -We will check orthostatic vital  signs -Recommend follow-up with ENT as an outpatient if episodes of dizziness persist -Patient could benefit from Epley's maneuver.  However, I am unable to perform this on telemetry.  Discussed with Dr. 71 to see if PT/OT is able to do it.  -If repeat CT head is within normal limits, patient will be ready for discharge from neurology standpoint -Plan discussed in detail with patient and her daughter at bedside  Thank you for allowing Sherryll Burger to participate in the care of this patient. If you have any further questions, please contact  me or neurohospitalist.   [place] [facility] neurohospitalist

## 2021-01-17 NOTE — Evaluation (Addendum)
Physical Therapy Evaluation Patient Details Name: Leslie Bentley MRN: [id number] DOB: 1955/05/31 Today's Date: 01/17/2021  History of Present Illness  Leslie Bentley is a 65 y.o. female with medical history significant for hypertension, GERD who presents to the emergency department due to dizziness and left-sided neck pain.  Patient states that while walking out of [employer] yesterday with daughter, she felt a left-sided neck pain which was associated with left-sided weakness.  Patient states that she has to lean on daughter till she gets to the car.  Daughter was concerned that symptoms may be due to a stroke, so she was brought to the ED for further evaluation and management.  Patient denies chest pain, shortness of breath nausea, vomiting, abdominal pain or any extremity weakness, numbness or tingling.  Apparently, patient has had some intermittent dizziness within past few weeks, but yesterday's dizziness was considered worse.   Clinical Impression  Patient presents in bed awake, alert, and agreeable for therapy. Patient functioning near baseline for functional mobility and gait other than c/o lightheadedness/dizziness. Patient ambulates in room and hallway w/out AD, demonstrates steadiness on feet but reaches for the wall or other objects for stability occasionally, demonstrates good return for using SPC, and c/o light headedness/dizziness w/ standing. Patient encouraged to ambulate ad lib while in hospital and consider using SPC when feeling dizzy at home.  Patient discharged to care of nursing for ambulation daily as tolerated for length of stay.     Vestibular Assessment:  Patient' orthostatics WNL without c/o dizziness during sit to stands,  performed right/left sided Hall Pike-Dix maneuvers with no nystagmus observed or c/o vertigo (room spinning), patient only c/o mild pain in left sided anterior lateral part of neck with referral to left shoulder blade and mild dizziness that resolved  after 1-2 minutes.  Recommendation:  encouraged patient to follow up with ortho surgeon to assess if she need to follow up with out patient physical therapy for possible treatment of neck pain.   Recommendations for follow up therapy are one component of a multi-disciplinary discharge planning process, led by the attending physician.  Recommendations may be updated based on patient status, additional functional criteria and insurance authorization.  Follow Up Recommendations No PT follow up    Assistance Recommended at Discharge PRN  Functional Status Assessment Patient has not had a recent decline in their functional status  Equipment Recommendations  None recommended by PT    Recommendations for Other Services       Precautions / Restrictions Precautions Precautions: Fall Restrictions Weight Bearing Restrictions: No      Mobility  Bed Mobility Overal bed mobility: Independent                  Transfers Overall transfer level: Independent Equipment used: None               General transfer comment: Steady on feet when moving from bed to chair and performing sit to stands    Ambulation/Gait Ambulation/Gait assistance: Modified independent (Device/Increase time) Gait Distance (Feet): 150 Feet Assistive device: Straight cane Gait Pattern/deviations: Step-through pattern Gait velocity: Near baseline     General Gait Details: Reaches for wall and other objects for stability w/ ambulating w/out AD. Using staight cane, demonstrates steadiness on feet, c/o lightheadedness/dizziness, no near loss of balance.  Stairs Stairs: Yes Stairs assistance: Modified independent (Device/Increase time) Stair Management: One rail Left;Alternating pattern;Forwards Number of Stairs: 6 General stair comments: Demonstrated steadiness on feet  Wheelchair Mobility  Modified Rankin (Stroke Patients Only)       Balance Overall balance assessment: Needs assistance;Modified  Independent   Sitting balance-Leahy Scale: Good Sitting balance - Comments: seated at EOB   Standing balance support: Single extremity supported;During functional activity;Reliant on assistive device for balance Standing balance-Leahy Scale: Good Standing balance comment: using a straight cane                             Pertinent Vitals/Pain Pain Assessment: Faces Pain Score: 0-No pain    Home Living Family/patient expects to be discharged to:: Private residence Living Arrangements: Spouse/significant other Available Help at Discharge: Family;Available PRN/intermittently Type of Home: House Home Access: Stairs to enter Entrance Stairs-Rails: None Entrance Stairs-Number of Steps: 5 Alternate Level Stairs-Number of Steps: 13 Home Layout: Two level;Laundry or work area in basement;Able to live on main level with bedroom/bathroom Home Equipment: Gilmer Mor - single point      Prior Function               Mobility Comments: Independent household and community ambulator, drives. ADLs Comments: Independent w/ ADL's.     Hand Dominance        Extremity/Trunk Assessment   Upper Extremity Assessment Upper Extremity Assessment: Defer to OT evaluation    Lower Extremity Assessment Lower Extremity Assessment: Overall WFL for tasks assessed       Communication   Communication: No difficulties  Cognition Arousal/Alertness: Awake/alert Behavior During Therapy: WFL for tasks assessed/performed Overall Cognitive Status: Within Functional Limits for tasks assessed                                          General Comments      Exercises     Assessment/Plan    PT Assessment Patient does not need any further PT services  PT Problem List         PT Treatment Interventions      PT Goals (Current goals can be found in the Care Plan section)  Acute Rehab PT Goals Patient Stated Goal: Return home. PT Goal Formulation: With patient Time For  Goal Achievement: 01/19/21 Potential to Achieve Goals: Good    Frequency     Barriers to discharge        Co-evaluation               AM-PAC PT "6 Clicks" Mobility  Outcome Measure Help needed turning from your back to your side while in a flat bed without using bedrails?: None Help needed moving from lying on your back to sitting on the side of a flat bed without using bedrails?: None Help needed moving to and from a bed to a chair (including a wheelchair)?: None Help needed standing up from a chair using your arms (e.g., wheelchair or bedside chair)?: None Help needed to walk in hospital room?: A Little Help needed climbing 3-5 steps with a railing? : A Little 6 Click Score: 22    End of Session   Activity Tolerance: Patient tolerated treatment well Patient left: in bed;with call bell/phone within reach Nurse Communication: Mobility status PT Visit Diagnosis: Unsteadiness on feet (R26.81);Other abnormalities of gait and mobility (R26.89);Muscle weakness (generalized) (M62.81)    Time: 4098-1191 PT Time Calculation (min) (ACUTE ONLY): 20 min   Charges:   PT Evaluation $PT Eval Moderate Complexity: 1 Mod PT Treatments $  Therapeutic Activity: 8-22 mins        Cassie Jones, SPT  During this treatment session, the therapist was present, participating in and directing the treatment.  2:08 PM, 01/17/21 Ocie Bob, MPT Physical Therapist with [facility] [phone] office [phone] mobile phone

## 2021-01-18 ENCOUNTER — Observation Stay (HOSPITAL_COMMUNITY): Payer: BC Managed Care – PPO

## 2021-01-18 DIAGNOSIS — I639 Cerebral infarction, unspecified: Secondary | ICD-10-CM | POA: Diagnosis not present

## 2021-01-18 LAB — COMPREHENSIVE METABOLIC PANEL
ALT: 14 U/L (ref 0–44)
AST: 18 U/L (ref 15–41)
Albumin: 3.7 g/dL (ref 3.5–5.0)
Alkaline Phosphatase: 65 U/L (ref 38–126)
Anion gap: 4 — ABNORMAL LOW (ref 5–15)
BUN: 17 mg/dL (ref 8–23)
CO2: 29 mmol/L (ref 22–32)
Calcium: 9.1 mg/dL (ref 8.9–10.3)
Chloride: 106 mmol/L (ref 98–111)
Creatinine, Ser: 0.76 mg/dL (ref 0.44–1.00)
GFR, Estimated: >60 mL/min (ref >60)
Glucose, Bld: 102 mg/dL — ABNORMAL HIGH (ref 70–99)
Potassium: 4.1 mmol/L (ref 3.5–5.1)
Sodium: 139 mmol/L (ref 135–145)
Total Bilirubin: 0.3 mg/dL (ref 0.3–1.2)
Total Protein: 7.3 g/dL (ref 6.5–8.1)

## 2021-01-18 LAB — PHOSPHORUS: Phosphorus: 2.9 mg/dL (ref 2.5–4.6)

## 2021-01-18 LAB — CBC
HCT: 35.9 % — ABNORMAL LOW (ref 36.0–46.0)
Hemoglobin: 11.4 g/dL — ABNORMAL LOW (ref 12.0–15.0)
MCH: 29.2 pg (ref 26.0–34.0)
MCHC: 31.8 g/dL (ref 30.0–36.0)
MCV: 91.8 fL (ref 80.0–100.0)
Platelets: 225 10*3/uL (ref 150–400)
RBC: 3.91 MIL/uL (ref 3.87–5.11)
RDW: 13.2 % (ref 11.5–15.5)
WBC: 6.5 10*3/uL (ref 4.0–10.5)
nRBC: 0 % (ref 0.0–0.2)

## 2021-01-18 LAB — MAGNESIUM: Magnesium: 2.2 mg/dL (ref 1.7–2.4)

## 2021-01-18 MED ORDER — MECLIZINE HCL 25 MG PO TABS: 25.0000 mg | ORAL_TABLET | Freq: Two times a day (BID) | ORAL | 0 refills | Status: AC | PRN

## 2021-01-18 NOTE — Progress Notes (Signed)
Discharge instructions, RX's and follow up appts explained and provided to patient and husband verbalized understanding. Patient left floor via wheelchair accompanied by staff.  Tina Gruner Lynn, RN  

## 2021-01-18 NOTE — Plan of Care (Signed)
  Problem: Education: Goal: Knowledge of General Education information will improve Description: Including pain rating scale, medication(s)/side effects and non-pharmacologic comfort measures Outcome: Progressing   Problem: Health Behavior/Discharge Planning: Goal: Ability to manage health-related needs will improve Outcome: Progressing   Problem: Clinical Measurements: Goal: Ability to maintain clinical measurements within normal limits will improve Outcome: Progressing Goal: Will remain free from infection Outcome: Progressing Goal: Diagnostic test results will improve Outcome: Progressing Goal: Respiratory complications will improve Outcome: Progressing Goal: Cardiovascular complication will be avoided Outcome: Progressing   Problem: Activity: Goal: Risk for activity intolerance will decrease Outcome: Progressing   Problem: Nutrition: Goal: Adequate nutrition will be maintained Outcome: Progressing   Problem: Coping: Goal: Level of anxiety will decrease Outcome: Progressing   Problem: Elimination: Goal: Will not experience complications related to bowel motility Outcome: Progressing Goal: Will not experience complications related to urinary retention Outcome: Progressing   Problem: Pain Managment: Goal: General experience of comfort will improve Outcome: Progressing   Problem: Safety: Goal: Ability to remain free from injury will improve Outcome: Progressing   Problem: Skin Integrity: Goal: Risk for impaired skin integrity will decrease Outcome: Progressing   Problem: Education: Goal: Knowledge of disease or condition will improve Outcome: Progressing Goal: Knowledge of secondary prevention will improve (SELECT ALL) Outcome: Progressing Goal: Knowledge of patient specific risk factors will improve (INDIVIDUALIZE FOR PATIENT) Outcome: Progressing Goal: Individualized Educational Video(s) Outcome: Progressing   Problem: Coping: Goal: Will verbalize  positive feelings about self Outcome: Progressing Goal: Will identify appropriate support needs Outcome: Progressing   Problem: Self-Care: Goal: Ability to participate in self-care as condition permits will improve Outcome: Progressing Goal: Verbalization of feelings and concerns over difficulty with self-care will improve Outcome: Progressing   Problem: Ischemic Stroke/TIA Tissue Perfusion: Goal: Complications of ischemic stroke/TIA will be minimized Outcome: Progressing

## 2021-01-18 NOTE — Discharge Summary (Signed)
Physician Discharge Summary  Leslie Bentley YOV:[id number] DOB: [birth year] DOA: 01/16/2021  PCP: Erasmo Downer, NP  Admit date: 01/16/2021  Discharge date: 01/18/2021  Admitted From:Home  Disposition:  Home  Recommendations for Outpatient Follow-up:  Follow up with PCP in 1-2 weeks Continue to take meclizine as needed for any dizziness Continue other home medications as prior Follow-up with ENT as needed for persistent dizziness with information given  Home Health: None  Equipment/Devices: None  Discharge Condition:Stable  CODE STATUS: Full  Diet recommendation: Heart Healthy  Brief/Interim Summary: Leslie Bentley is a 65 y.o. female with medical history significant for hypertension, GERD who presents to the emergency department due to dizziness and left-sided neck pain.  CT head was negative for any acute findings and she cannot receive MRI due to hardware in her body.  She had repeat head CT in 24 hours with no findings of CVA still noted.  She was seen by Teleneurology with recommendations to remain on her prior aspirin and statin and to use meclizine as needed for any further dizziness/vertigo symptoms.  PT had seen the patient to try to do Epley maneuver, but she was noted to have some neck pain upon doing this.  She has been informed to follow-up with her orthopedic surgeon should pain in her neck continue to persist as well as with ENT should her dizziness persist.  No other acute events noted throughout the course of the stay and she is overall stable for discharge.  Discharge Diagnoses:  Principal Problem:   Acute ischemic stroke [facility]) Active Problems:   Neck pain on left side   Dizziness   Essential hypertension   GERD (gastroesophageal reflux disease)  Principal discharge diagnosis: Transient vertigo suggestive of BPPV.  Discharge Instructions  Discharge Instructions     Diet - low sodium heart healthy   Complete by: As directed    Increase  activity slowly   Complete by: As directed       Allergies as of 01/18/2021   No Known Allergies      Medication List     TAKE these medications    amLODipine 5 MG tablet Commonly known as: NORVASC Take 5 mg by mouth every morning.   aspirin 81 MG chewable tablet Chew 81 mg by mouth at bedtime.   cetirizine 10 MG tablet Commonly known as: ZYRTEC Take 10 mg by mouth at bedtime.   cholecalciferol 1000 units tablet Commonly known as: VITAMIN D Take 1,000 Units by mouth daily.   hydrochlorothiazide 25 MG tablet Commonly known as: HYDRODIURIL Take 25 mg by mouth every morning.   meclizine 25 MG tablet Commonly known as: ANTIVERT Take 1 tablet (25 mg total) by mouth 2 (two) times daily as needed for dizziness.   metoprolol succinate 100 MG 24 hr tablet Commonly known as: TOPROL-XL Take 100 mg by mouth at bedtime.   pantoprazole 40 MG tablet Commonly known as: PROTONIX Take 40 mg by mouth every morning.   potassium chloride SA 20 MEQ tablet Commonly known as: KLOR-CON Take 1 tablet (20 mEq total) by mouth daily.   Propylene Glycol 0.6 % Soln Place 1-2 drops into the left eye daily as needed (for irritation).   simvastatin 20 MG tablet Commonly known as: ZOCOR Take 20 mg by mouth at bedtime.        Follow-up Information     Erasmo Downer, NP. Schedule an appointment as soon as possible for a visit in 1 week(s).   Specialty: Nurse  Practitioner Contact information: [address] [place] [address] [phone]         Newman Pies, MD. Schedule an appointment as soon as possible for a visit.   Specialty: Otolaryngology Contact information: [address] [place] [address] [phone]                No Known Allergies  Consultations: Neurology-Dr. Melynda Ripple   Procedures/Studies: CT ANGIO HEAD NECK W WO CM  Result Date: 01/17/2021 CLINICAL DATA:  Dizziness, hand weakness, neck pain EXAM: CT ANGIOGRAPHY HEAD AND  NECK TECHNIQUE: Multidetector CT imaging of the head and neck was performed using the standard protocol during bolus administration of intravenous contrast. Multiplanar CT image reconstructions and MIPs were obtained to evaluate the vascular anatomy. Carotid stenosis measurements (when applicable) are obtained utilizing NASCET criteria, using the distal internal carotid diameter as the denominator. CONTRAST:  75mL OMNIPAQUE IOHEXOL 350 MG/ML SOLN COMPARISON:  No prior CTA, correlation is made with 11/07/2016 CT head FINDINGS: CT HEAD FINDINGS Brain: No acute infarct, hemorrhage, mass, mass effect, or midline shift. No hydrocephalus or extra-axial collection. Vascular: No hyperdense vessel. Skull: No acute osseous abnormality. Sinuses: Imaged portions are clear. Orbits: No acute finding. Review of the MIP images confirms the above findings CTA NECK FINDINGS Aortic arch: Bovine imaged portion shows no evidence of aneurysm or dissection. No significant stenosis of the major arch vessel origins. Right carotid system: No evidence of dissection, stenosis (50% or greater) or occlusion. Left carotid system: No evidence of dissection, stenosis (50% or greater) or occlusion. Vertebral arteries: Codominant. No evidence of dissection, stenosis (50% or greater) or occlusion. Skeleton: Status post ACDF C6-C7. Degenerative changes in the cervical spine. No acute osseous abnormality. Other neck: Negative. Upper chest: Right greater than left apical pleuroparenchymal scarring. No focal pulmonary opacity. Review of the MIP images confirms the above findings CTA HEAD FINDINGS Anterior circulation: Both internal carotid arteries are patent to the termini, without stenosis or other abnormality. Hypoplastic right A1. A1 segments patent. Normal anterior communicating artery. Anterior cerebral arteries are patent to their distal aspects. No M1 stenosis or occlusion. Normal MCA bifurcations. Distal MCA branches perfused and symmetric.  Posterior circulation: Vertebral arteries patent to the vertebrobasilar junction without stenosis. Basilar patent to its distal aspect. Superior cerebellar arteries patent bilaterally. PCAs perfused to their distal aspects without stenosis. Bilateral posterior communicating arteries are not visualized. Venous sinuses: As permitted by contrast timing, patent. Anatomic variants: None significant Review of the MIP images confirms the above findings IMPRESSION: 1. No acute intracranial process. 2. No intracranial large vessel occlusion or significant stenosis. 3. No hemodynamically significant stenosis in the neck. Electronically Signed   By: Wiliam Ke M.D.   On: 01/17/2021 01:32   CT HEAD WO CONTRAST ( )  Result Date: 01/18/2021 CLINICAL DATA:  Follow-up stroke-like symptoms EXAM: CT HEAD WITHOUT CONTRAST TECHNIQUE: Contiguous axial images were obtained from the base of the skull through the vertex without intravenous contrast. COMPARISON:  01/16/21 FINDINGS: Brain: No evidence of acute infarction, hemorrhage, hydrocephalus, extra-axial collection or mass lesion/mass effect. Vascular: No hyperdense vessel or unexpected calcification. Skull: Normal. Negative for fracture or focal lesion. Sinuses/Orbits: No acute finding. Other: None. IMPRESSION: No acute intracranial abnormality noted. Electronically Signed   By: Alcide Clever M.D.   On: 01/18/2021 03:43   ECHOCARDIOGRAM COMPLETE  Result Date: 01/17/2021    ECHOCARDIOGRAM REPORT   Patient Name:   Leslie Bentley Date of Exam: 01/17/2021 Medical Rec #:  [id number]  Height:       62.0 in Accession #:    [id number]        Weight:       146.4 lb Date of Birth:  Apr 24, 1955         BSA:          1.674 m Patient Age:    64 years          BP:           129/49 mmHg Patient Gender: F                 HR:           80 bpm. [facility] Procedure: 2D Echo, Cardiac Doppler and Color Doppler Indications:    Stroke  History:        Patient has no  prior history of Echocardiogram examinations.                 Stroke; Risk Factors:Hypertension.  Sonographer:    Mikki Harbor Referring Phys: [id number] OLADAPO ADEFESO IMPRESSIONS  1. Left ventricular ejection fraction, by estimation, is 55 to 60%. The left ventricle has normal function. The left ventricle has no regional wall motion abnormalities. Left ventricular diastolic parameters are consistent with Grade I diastolic dysfunction (impaired relaxation).  2. Right ventricular systolic function is normal. The right ventricular size is normal. There is normal pulmonary artery systolic pressure.  3. The mitral valve is normal in structure. No evidence of mitral valve regurgitation. No evidence of mitral stenosis.  4. The aortic valve has an indeterminant number of cusps. Aortic valve regurgitation is not visualized. No aortic stenosis is present. FINDINGS  Left Ventricle: Left ventricular ejection fraction, by estimation, is 55 to 60%. The left ventricle has normal function. The left ventricle has no regional wall motion abnormalities. The left ventricular internal cavity size was normal in size. There is  no left ventricular hypertrophy. Left ventricular diastolic parameters are consistent with Grade I diastolic dysfunction (impaired relaxation). Normal left ventricular filling pressure. Right Ventricle: The right ventricular size is normal. No increase in right ventricular wall thickness. Right ventricular systolic function is normal. There is normal pulmonary artery systolic pressure. The tricuspid regurgitant velocity is 1.85 m/s, and  with an assumed right atrial pressure of 3 mmHg, the estimated right ventricular systolic pressure is 16.7 mmHg. Left Atrium: Left atrial size was normal in size. Right Atrium: Right atrial size was normal in size. Pericardium: There is no evidence of pericardial effusion. Mitral Valve: The mitral valve is normal in structure. No evidence of mitral valve regurgitation. No  evidence of mitral valve stenosis. MV peak gradient, 3.9 mmHg. The mean mitral valve gradient is 2.0 mmHg. Tricuspid Valve: The tricuspid valve is normal in structure. Tricuspid valve regurgitation is not demonstrated. No evidence of tricuspid stenosis. Aortic Valve: The aortic valve has an indeterminant number of cusps. Aortic valve regurgitation is not visualized. No aortic stenosis is present. Aortic valve mean gradient measures 4.0 mmHg. Aortic valve peak gradient measures 7.5 mmHg. Aortic valve area, by VTI measures 2.43 cm. Pulmonic Valve: The pulmonic valve was not well visualized. Pulmonic valve regurgitation is trivial. No evidence of pulmonic stenosis. Aorta: The aortic root is normal in size and structure. Venous: IVC is small suggesting low RA pressure and hypovolemia. IAS/Shunts: No atrial level shunt detected by color flow Doppler.  LEFT VENTRICLE PLAX 2D LVIDd:         3.90 cm  Diastology LVIDs:         2.90 cm     LV e' medial:    8.38 cm/s LV PW:         0.90 cm     LV E/e' medial:  7.7 LV IVS:        1.00 cm     LV e' lateral:   11.50 cm/s LVOT diam:     1.90 cm     LV E/e' lateral: 5.6 LV SV:         66 LV SV Index:   39 LVOT Area:     2.84 cm  LV Volumes (MOD) LV vol d, MOD A2C: 47.2 ml LV vol d, MOD A4C: 52.2 ml LV vol s, MOD A2C: 18.4 ml LV vol s, MOD A4C: 21.4 ml LV SV MOD A2C:     28.8 ml LV SV MOD A4C:     52.2 ml LV SV MOD BP:      30.5 ml RIGHT VENTRICLE RV Basal diam:  2.95 cm RV Mid diam:    2.50 cm RV S prime:     11.10 cm/s TAPSE (M-mode): 1.5 cm LEFT ATRIUM             Index        RIGHT ATRIUM           Index LA diam:        2.60 cm 1.55 cm/m   RA Area:     12.00 cm LA Vol (A2C):   28.8 ml 17.20 ml/m  RA Volume:   26.20 ml  15.65 ml/m LA Vol (A4C):   16.7 ml 9.97 ml/m LA Biplane Vol: 22.4 ml 13.38 ml/m  AORTIC VALVE                    PULMONIC VALVE AV Area (Vmax):    2.28 cm     PV Vmax:       0.77 m/s AV Area (Vmean):   2.42 cm     PV Peak grad:  2.4 mmHg AV Area  (VTI):     2.43 cm AV Vmax:           137.00 cm/s AV Vmean:          91.000 cm/s AV VTI:            0.271 m AV Peak Grad:      7.5 mmHg AV Mean Grad:      4.0 mmHg LVOT Vmax:         110.00 cm/s LVOT Vmean:        77.600 cm/s LVOT VTI:          0.232 m LVOT/AV VTI ratio: 0.86  AORTA Ao Root diam: 2.50 cm MITRAL VALVE               TRICUSPID VALVE MV Area (PHT): 2.62 cm    TR Peak grad:   13.7 mmHg MV Area VTI:   2.71 cm    TR Vmax:        185.00 cm/s MV Peak grad:  3.9 mmHg MV Mean grad:  2.0 mmHg    SHUNTS MV Vmax:       0.99 m/s    Systemic VTI:  0.23 m MV Vmean:      58.8 cm/s   Systemic Diam: 1.90 cm MV Decel Time: 290 msec MV E velocity: 64.70 cm/s MV A velocity: 84.80 cm/s MV E/A ratio:  0.76 [employer]  MD Electronically signed by Dina Rich MD Signature Date/Time: 01/17/2021/12:30:01 PM    Final      Discharge Exam: Vitals:   01/18/21 0229 01/18/21 0505  BP: (!) 119/59 126/62  Pulse: 85 76  Resp: 20 19  Temp: (!) 97.5 F (36.4 C) 98 F (36.7 C)  SpO2: 97% 96%   Vitals:   01/17/21 1900 01/17/21 2035 01/18/21 0229 01/18/21 0505  BP: (!) 120/58 119/63 (!) 119/59 126/62  Pulse: 70 67 85 76  Resp: Temp: 98.2 F (36.8 C) 98.6 F (37 C) (!) 97.5 F (36.4 C) 98 F (36.7 C)  TempSrc: Oral Oral    SpO2: 97% 99% 97% 96%  Weight:      Height:        General: Pt is alert, awake, not in acute distress Cardiovascular: RRR, S1/S2 +, no rubs, no gallops Respiratory: CTA bilaterally, no wheezing, no rhonchi Abdominal: Soft, NT, ND, bowel sounds + Extremities: no edema, no cyanosis    The results of significant diagnostics from this hospitalization (including imaging, microbiology, ancillary and laboratory) are listed below for reference.     Microbiology: Recent Results (from the past 240 hour(s))  Resp Panel by RT-PCR (Flu A&B, Covid) Nasopharyngeal Swab     Status: None   Collection Time: 01/16/21 11:10 PM   Specimen: Nasopharyngeal Swab;  Nasopharyngeal(NP) swabs in vial transport medium  Result Value Ref Range Status   SARS Coronavirus 2 by RT PCR NEGATIVE NEGATIVE Final    Comment: (NOTE) SARS-CoV-2 target nucleic acids are NOT DETECTED.  The SARS-CoV-2 RNA is generally detectable in upper respiratory specimens during the acute phase of infection. The lowest concentration of SARS-CoV-2 viral copies this assay can detect is 138 copies/mL. A negative result does not preclude SARS-Cov-2 infection and should not be used as the sole basis for treatment or other patient management decisions. A negative result may occur with  improper specimen collection/handling, submission of specimen other than nasopharyngeal swab, presence of viral mutation(s) within the areas targeted by this assay, and inadequate number of viral copies(<138 copies/mL). A negative result must be combined with clinical observations, patient history, and epidemiological information. The expected result is Negative.  Fact Sheet for Patients:  BloggerCourse.com  Fact Sheet for Healthcare Providers:  SeriousBroker.it  This test is no t yet approved or cleared by the [place] FDA and  has been authorized for detection and/or diagnosis of SARS-CoV-2 by FDA under an Emergency Use Authorization (EUA). This EUA will remain  in effect (meaning this test can be used) for the duration of the COVID-19 declaration under Section 564(b)(1) of the Act, 21 U.S.C.section 360bbb-3(b)(1), unless the authorization is terminated  or revoked sooner.       Influenza A by PCR NEGATIVE NEGATIVE Final   Influenza B by PCR NEGATIVE NEGATIVE Final    Comment: (NOTE) The Xpert Xpress SARS-CoV-2/FLU/RSV plus assay is intended as an aid in the diagnosis of influenza from Nasopharyngeal swab specimens and should not be used as a sole basis for treatment. Nasal washings and aspirates are unacceptable for Xpert Xpress  SARS-CoV-2/FLU/RSV testing.  Fact Sheet for Patients: BloggerCourse.com  Fact Sheet for Healthcare Providers: SeriousBroker.it  This test is not yet approved or cleared by the [place] FDA and has been authorized for detection and/or diagnosis of SARS-CoV-2 by FDA under an Emergency Use Authorization (EUA). This EUA will remain in effect (meaning this test can be used) for the duration of the COVID-19 declaration  under Section 564(b)(1) of the Act, 21 U.S.C. section 360bbb-3(b)(1), unless the authorization is terminated or revoked.  Performed at [facility], [address] [place], [place] [address]      Labs: BNP (last 3 results) No results for input(s): BNP in the last 8760 hours. Basic Metabolic Panel: Recent Labs  Lab 01/16/21 1953 01/17/21 0117 01/18/21 0541  NA 137  --  139  K 2.8*  --  4.1  CL 100  --  106  CO2 31  --  29  GLUCOSE 137*  --  102*  BUN 15  --  17  CREATININE 0.83  --  0.76  CALCIUM 9.1  --  9.1  MG  --  2.1 2.2  PHOS  --   --  2.9   Liver Function Tests: Recent Labs  Lab 01/18/21 0541  AST 18  ALT 14  ALKPHOS 65  BILITOT 0.3  PROT 7.3  ALBUMIN 3.7   No results for input(s): LIPASE, AMYLASE in the last 168 hours. No results for input(s): AMMONIA in the last 168 hours. CBC: Recent Labs  Lab 01/16/21 1953 01/18/21 0541  WBC 7.9 6.5  NEUTROABS 5.2  --   HGB 12.5 11.4*  HCT 37.9 35.9*  MCV 89.8 91.8  PLT 213 225   Cardiac Enzymes: No results for input(s): CKTOTAL, CKMB, CKMBINDEX, TROPONINI in the last 168 hours. BNP: Invalid input(s): POCBNP CBG: No results for input(s): GLUCAP in the last 168 hours. D-Dimer No results for input(s): DDIMER in the last 72 hours. Hgb A1c Recent Labs    01/16/21 1953  HGBA1C 5.5   Lipid Profile Recent Labs    01/17/21 0117  CHOL 119  HDL 50  LDLCALC 57  TRIG 61  CHOLHDL 2.4   Thyroid function studies No results for input(s):  TSH, T4TOTAL, T3FREE, THYROIDAB in the last 72 hours.  Invalid input(s): FREET3 Anemia work up No results for input(s): VITAMINB12, FOLATE, FERRITIN, TIBC, IRON, RETICCTPCT in the last 72 hours. Urinalysis    Component Value Date/Time   COLORURINE YELLOW 11/17/2014 1339   APPEARANCEUR CLEAR 11/17/2014 1339   LABSPEC 1.010 11/17/2014 1339   PHURINE 6.0 11/17/2014 1339   GLUCOSEU NEGATIVE 11/17/2014 1339   HGBUR NEGATIVE 11/17/2014 1339   BILIRUBINUR NEGATIVE 11/17/2014 1339   KETONESUR NEGATIVE 11/17/2014 1339   PROTEINUR NEGATIVE 11/17/2014 1339   UROBILINOGEN 0.2 11/17/2014 1339   NITRITE NEGATIVE 11/17/2014 1339   LEUKOCYTESUR SMALL (A) 11/17/2014 1339   Sepsis Labs Invalid input(s): PROCALCITONIN,  WBC,  LACTICIDVEN Microbiology Recent Results (from the past 240 hour(s))  Resp Panel by RT-PCR (Flu A&B, Covid) Nasopharyngeal Swab     Status: None   Collection Time: 01/16/21 11:10 PM   Specimen: Nasopharyngeal Swab; Nasopharyngeal(NP) swabs in vial transport medium  Result Value Ref Range Status   SARS Coronavirus 2 by RT PCR NEGATIVE NEGATIVE Final    Comment: (NOTE) SARS-CoV-2 target nucleic acids are NOT DETECTED.  The SARS-CoV-2 RNA is generally detectable in upper respiratory specimens during the acute phase of infection. The lowest concentration of SARS-CoV-2 viral copies this assay can detect is 138 copies/mL. A negative result does not preclude SARS-Cov-2 infection and should not be used as the sole basis for treatment or other patient management decisions. A negative result may occur with  improper specimen collection/handling, submission of specimen other than nasopharyngeal swab, presence of viral mutation(s) within the areas targeted by this assay, and inadequate number of viral copies(<138 copies/mL). A negative result must be  combined with clinical observations, patient history, and epidemiological information. The expected result is Negative.  Fact Sheet  for Patients:  BloggerCourse.com  Fact Sheet for Healthcare Providers:  SeriousBroker.it  This test is no t yet approved or cleared by the [place] FDA and  has been authorized for detection and/or diagnosis of SARS-CoV-2 by FDA under an Emergency Use Authorization (EUA). This EUA will remain  in effect (meaning this test can be used) for the duration of the COVID-19 declaration under Section 564(b)(1) of the Act, 21 U.S.C.section 360bbb-3(b)(1), unless the authorization is terminated  or revoked sooner.       Influenza A by PCR NEGATIVE NEGATIVE Final   Influenza B by PCR NEGATIVE NEGATIVE Final    Comment: (NOTE) The Xpert Xpress SARS-CoV-2/FLU/RSV plus assay is intended as an aid in the diagnosis of influenza from Nasopharyngeal swab specimens and should not be used as a sole basis for treatment. Nasal washings and aspirates are unacceptable for Xpert Xpress SARS-CoV-2/FLU/RSV testing.  Fact Sheet for Patients: BloggerCourse.com  Fact Sheet for Healthcare Providers: SeriousBroker.it  This test is not yet approved or cleared by the [place] FDA and has been authorized for detection and/or diagnosis of SARS-CoV-2 by FDA under an Emergency Use Authorization (EUA). This EUA will remain in effect (meaning this test can be used) for the duration of the COVID-19 declaration under Section 564(b)(1) of the Act, 21 U.S.C. section 360bbb-3(b)(1), unless the authorization is terminated or revoked.  Performed at [facility], [address], [place], [place] [address]      Time coordinating discharge: 35 minutes  SIGNED:   Erick Blinks, DO [facility] 01/18/2021, 9:31 AM  If 7PM-7AM, please contact night-coverage [email or web address]

## 2021-04-05 ENCOUNTER — Other Ambulatory Visit: Payer: Self-pay

## 2021-04-05 ENCOUNTER — Ambulatory Visit
Admission: RE | Admit: 2021-04-05 | Discharge: 2021-04-05 | Disposition: A | Discharge status: Home / Self Care | Payer: BC Managed Care – PPO | Source: Ambulatory Visit | Attending: Nurse Practitioner | Admitting: Nurse Practitioner

## 2021-04-05 DIAGNOSIS — Z1231 Encounter for screening mammogram for malignant neoplasm of breast: Secondary | ICD-10-CM | POA: Insufficient documentation

## 2022-03-11 ENCOUNTER — Other Ambulatory Visit: Payer: Self-pay | Admitting: Nurse Practitioner

## 2022-03-11 DIAGNOSIS — Z1231 Encounter for screening mammogram for malignant neoplasm of breast: Secondary | ICD-10-CM

## 2022-04-08 ENCOUNTER — Ambulatory Visit
Admission: RE | Admit: 2022-04-08 | Discharge: 2022-04-08 | Disposition: A | Discharge status: Home / Self Care | Payer: BC Managed Care – PPO | Source: Ambulatory Visit | Attending: Nurse Practitioner | Admitting: Nurse Practitioner

## 2022-04-08 DIAGNOSIS — Z1231 Encounter for screening mammogram for malignant neoplasm of breast: Secondary | ICD-10-CM | POA: Insufficient documentation

## 2023-04-07 ENCOUNTER — Other Ambulatory Visit: Payer: Self-pay | Admitting: Nurse Practitioner

## 2023-04-07 DIAGNOSIS — Z1231 Encounter for screening mammogram for malignant neoplasm of breast: Secondary | ICD-10-CM

## 2023-06-17 ENCOUNTER — Ambulatory Visit: Payer: Self-pay

## 2023-06-17 DIAGNOSIS — K640 First degree hemorrhoids: Secondary | ICD-10-CM | POA: Diagnosis not present

## 2023-06-17 DIAGNOSIS — K2950 Unspecified chronic gastritis without bleeding: Secondary | ICD-10-CM | POA: Diagnosis not present

## 2023-06-17 DIAGNOSIS — Z1211 Encounter for screening for malignant neoplasm of colon: Secondary | ICD-10-CM | POA: Diagnosis present

## 2023-06-17 DIAGNOSIS — K2980 Duodenitis without bleeding: Secondary | ICD-10-CM | POA: Diagnosis not present

## 2023-06-17 DIAGNOSIS — D122 Benign neoplasm of ascending colon: Secondary | ICD-10-CM | POA: Diagnosis not present

## 2023-10-16 ENCOUNTER — Ambulatory Visit
Admission: RE | Admit: 2023-10-16 | Discharge: 2023-10-16 | Disposition: A | Discharge status: Home / Self Care | Source: Ambulatory Visit | Attending: Nurse Practitioner | Admitting: Nurse Practitioner

## 2023-10-16 DIAGNOSIS — Z1231 Encounter for screening mammogram for malignant neoplasm of breast: Secondary | ICD-10-CM | POA: Insufficient documentation
